# Patient Record
Sex: Female | Born: 1950 | Race: White | Hispanic: No | Marital: Single | Smoking: Never smoker
Health system: Southern US, Community
[De-identification: ages and names within clinical notes are randomized; demographics above are authoritative.]

## PROBLEM LIST (undated history)

## (undated) DIAGNOSIS — E669 Obesity, unspecified: Secondary | ICD-10-CM

## (undated) DIAGNOSIS — M5430 Sciatica, unspecified side: Secondary | ICD-10-CM

---

## 2021-03-01 ENCOUNTER — Emergency Department (HOSPITAL_COMMUNITY): Payer: Medicare HMO

## 2021-03-01 ENCOUNTER — Encounter (HOSPITAL_COMMUNITY): Payer: Self-pay | Admitting: Emergency Medicine

## 2021-03-01 ENCOUNTER — Other Ambulatory Visit: Payer: Self-pay

## 2021-03-01 ENCOUNTER — Inpatient Hospital Stay (HOSPITAL_COMMUNITY)
Admission: EM | Admit: 2021-03-01 | Discharge: 2021-03-06 | DRG: 177 | Disposition: A | Discharge status: Home / Self Care | Payer: Medicare HMO | Attending: Student | Admitting: Student

## 2021-03-01 DIAGNOSIS — R531 Weakness: Secondary | ICD-10-CM | POA: Diagnosis not present

## 2021-03-01 DIAGNOSIS — R739 Hyperglycemia, unspecified: Secondary | ICD-10-CM | POA: Diagnosis not present

## 2021-03-01 DIAGNOSIS — U071 COVID-19: Principal | ICD-10-CM

## 2021-03-01 DIAGNOSIS — D6959 Other secondary thrombocytopenia: Secondary | ICD-10-CM | POA: Diagnosis present

## 2021-03-01 DIAGNOSIS — J9601 Acute respiratory failure with hypoxia: Secondary | ICD-10-CM | POA: Diagnosis present

## 2021-03-01 DIAGNOSIS — R030 Elevated blood-pressure reading, without diagnosis of hypertension: Secondary | ICD-10-CM | POA: Diagnosis present

## 2021-03-01 DIAGNOSIS — Z6841 Body Mass Index (BMI) 40.0 and over, adult: Secondary | ICD-10-CM

## 2021-03-01 DIAGNOSIS — N179 Acute kidney failure, unspecified: Secondary | ICD-10-CM

## 2021-03-01 DIAGNOSIS — Z8249 Family history of ischemic heart disease and other diseases of the circulatory system: Secondary | ICD-10-CM

## 2021-03-01 DIAGNOSIS — T380X5A Adverse effect of glucocorticoids and synthetic analogues, initial encounter: Secondary | ICD-10-CM | POA: Diagnosis not present

## 2021-03-01 DIAGNOSIS — E876 Hypokalemia: Secondary | ICD-10-CM

## 2021-03-01 DIAGNOSIS — Z28310 Unvaccinated for covid-19: Secondary | ICD-10-CM

## 2021-03-01 DIAGNOSIS — J1282 Pneumonia due to coronavirus disease 2019: Secondary | ICD-10-CM | POA: Diagnosis present

## 2021-03-01 DIAGNOSIS — D72819 Decreased white blood cell count, unspecified: Secondary | ICD-10-CM

## 2021-03-01 DIAGNOSIS — M5430 Sciatica, unspecified side: Secondary | ICD-10-CM | POA: Diagnosis present

## 2021-03-01 HISTORY — DX: Sciatica, unspecified side: M54.30

## 2021-03-01 HISTORY — DX: Obesity, unspecified: E66.9

## 2021-03-01 NOTE — ED Provider Notes (Signed)
Emergency Medicine Provider Triage Evaluation Note  Deshante Cassell , a 70 y.o. female  was evaluated in triage.  Pt complains of cough and decreased appetite.  Patient states she tested positive for COVID-19 5 days ago.  She notes symptoms started on 8/1.  She is unvaccinated against COVID-19.  Patient endorses shortness of breath only while coughing.  Denies chest pain.  Denies nausea, vomiting, diarrhea, abdominal pain.  Review of Systems  Positive: Cough, SOB Negative: CP  Physical Exam  SpO2 95%  Gen:   Awake, no distress   Resp:  Normal effort  MSK:   Moves extremities without difficulty  Other:    Medical Decision Making  Medically screening exam initiated at 11:31 PM.  Appropriate orders placed.  Lola Czerwonka was informed that the remainder of the evaluation will be completed by another provider, this initial triage assessment does not replace that evaluation, and the importance of remaining in the ED until their evaluation is complete.  Routine labs ordered CXR EKG   Jesusita Oka 03/01/21 2332    Zadie Rhine, MD 03/03/21 [phone]

## 2021-03-01 NOTE — ED Triage Notes (Signed)
Pt arrived via EMS from home. Pt is covid positive and feels weak and lethargic. Pt has not been eating well and feels ill. Per EMS vitals stable.

## 2021-03-02 ENCOUNTER — Encounter (HOSPITAL_COMMUNITY): Payer: Self-pay | Admitting: Internal Medicine

## 2021-03-02 DIAGNOSIS — J1282 Pneumonia due to coronavirus disease 2019: Secondary | ICD-10-CM | POA: Diagnosis present

## 2021-03-02 DIAGNOSIS — N179 Acute kidney failure, unspecified: Secondary | ICD-10-CM

## 2021-03-02 DIAGNOSIS — R531 Weakness: Secondary | ICD-10-CM | POA: Diagnosis present

## 2021-03-02 DIAGNOSIS — U071 COVID-19: Secondary | ICD-10-CM | POA: Diagnosis present

## 2021-03-02 DIAGNOSIS — D72819 Decreased white blood cell count, unspecified: Secondary | ICD-10-CM | POA: Diagnosis present

## 2021-03-02 DIAGNOSIS — Z8249 Family history of ischemic heart disease and other diseases of the circulatory system: Secondary | ICD-10-CM | POA: Diagnosis not present

## 2021-03-02 DIAGNOSIS — E876 Hypokalemia: Secondary | ICD-10-CM | POA: Diagnosis present

## 2021-03-02 DIAGNOSIS — J9601 Acute respiratory failure with hypoxia: Secondary | ICD-10-CM

## 2021-03-02 DIAGNOSIS — D6959 Other secondary thrombocytopenia: Secondary | ICD-10-CM | POA: Diagnosis present

## 2021-03-02 DIAGNOSIS — Z28310 Unvaccinated for covid-19: Secondary | ICD-10-CM | POA: Diagnosis not present

## 2021-03-02 DIAGNOSIS — R739 Hyperglycemia, unspecified: Secondary | ICD-10-CM | POA: Diagnosis not present

## 2021-03-02 DIAGNOSIS — R748 Abnormal levels of other serum enzymes: Secondary | ICD-10-CM | POA: Diagnosis not present

## 2021-03-02 DIAGNOSIS — M5430 Sciatica, unspecified side: Secondary | ICD-10-CM | POA: Diagnosis present

## 2021-03-02 DIAGNOSIS — R7989 Other specified abnormal findings of blood chemistry: Secondary | ICD-10-CM | POA: Diagnosis not present

## 2021-03-02 DIAGNOSIS — Z6841 Body Mass Index (BMI) 40.0 and over, adult: Secondary | ICD-10-CM | POA: Diagnosis not present

## 2021-03-02 DIAGNOSIS — T380X5A Adverse effect of glucocorticoids and synthetic analogues, initial encounter: Secondary | ICD-10-CM | POA: Diagnosis not present

## 2021-03-02 DIAGNOSIS — R030 Elevated blood-pressure reading, without diagnosis of hypertension: Secondary | ICD-10-CM | POA: Diagnosis present

## 2021-03-02 LAB — CBC WITH DIFFERENTIAL/PLATELET
Abs Immature Granulocytes: 0.01 10*3/uL (ref 0.00–0.07)
Basophils Absolute: 0 10*3/uL (ref 0.0–0.1)
Basophils Relative: 0 %
Eosinophils Absolute: 0 10*3/uL (ref 0.0–0.5)
Eosinophils Relative: 0 %
HCT: 47.1 % — ABNORMAL HIGH (ref 36.0–46.0)
Hemoglobin: 14.9 g/dL (ref 12.0–15.0)
Immature Granulocytes: 0 %
Lymphocytes Relative: 25 %
Lymphs Abs: 0.8 10*3/uL (ref 0.7–4.0)
MCH: 28.2 pg (ref 26.0–34.0)
MCHC: 31.6 g/dL (ref 30.0–36.0)
MCV: 89 fL (ref 80.0–100.0)
Monocytes Absolute: 0.4 10*3/uL (ref 0.1–1.0)
Monocytes Relative: 11 %
Neutro Abs: 2.1 10*3/uL (ref 1.7–7.7)
Neutrophils Relative %: 64 %
Platelets: 164 10*3/uL (ref 150–400)
RBC: 5.29 MIL/uL — ABNORMAL HIGH (ref 3.87–5.11)
RDW: 12.9 % (ref 11.5–15.5)
WBC: 3.3 10*3/uL — ABNORMAL LOW (ref 4.0–10.5)
nRBC: 0 % (ref 0.0–0.2)

## 2021-03-02 LAB — TROPONIN I (HIGH SENSITIVITY)
Troponin I (High Sensitivity): 12 ng/L (ref <18)
Troponin I (High Sensitivity): 12 ng/L (ref <18)

## 2021-03-02 LAB — LACTATE DEHYDROGENASE: LDH: 199 U/L — ABNORMAL HIGH (ref 98–192)

## 2021-03-02 LAB — C-REACTIVE PROTEIN: CRP: 6.3 mg/dL — ABNORMAL HIGH (ref <1.0)

## 2021-03-02 LAB — D-DIMER, QUANTITATIVE: D-Dimer, Quant: 1.09 ug/mL-FEU — ABNORMAL HIGH (ref 0.00–0.50)

## 2021-03-02 LAB — CBC
HCT: 43.9 % (ref 36.0–46.0)
Hemoglobin: 14.1 g/dL (ref 12.0–15.0)
MCH: 28.8 pg (ref 26.0–34.0)
MCHC: 32.1 g/dL (ref 30.0–36.0)
MCV: 89.6 fL (ref 80.0–100.0)
Platelets: 139 10*3/uL — ABNORMAL LOW (ref 150–400)
RBC: 4.9 MIL/uL (ref 3.87–5.11)
RDW: 12.9 % (ref 11.5–15.5)
WBC: 3.2 10*3/uL — ABNORMAL LOW (ref 4.0–10.5)
nRBC: 0 % (ref 0.0–0.2)

## 2021-03-02 LAB — CREATININE, SERUM
Creatinine, Ser: 0.93 mg/dL (ref 0.44–1.00)
GFR, Estimated: >60 mL/min (ref >60)

## 2021-03-02 LAB — GLUCOSE, CAPILLARY: Glucose-Capillary: 145 mg/dL — ABNORMAL HIGH (ref 70–99)

## 2021-03-02 LAB — MAGNESIUM: Magnesium: 1.5 mg/dL — ABNORMAL LOW (ref 1.7–2.4)

## 2021-03-02 LAB — BASIC METABOLIC PANEL
Anion gap: 12 (ref 5–15)
BUN: 16 mg/dL (ref 8–23)
CO2: 28 mmol/L (ref 22–32)
Calcium: 8.8 mg/dL — ABNORMAL LOW (ref 8.9–10.3)
Chloride: 97 mmol/L — ABNORMAL LOW (ref 98–111)
Creatinine, Ser: 1.02 mg/dL — ABNORMAL HIGH (ref 0.44–1.00)
GFR, Estimated: 60 mL/min — ABNORMAL LOW (ref >60)
Glucose, Bld: 111 mg/dL — ABNORMAL HIGH (ref 70–99)
Potassium: 3.3 mmol/L — ABNORMAL LOW (ref 3.5–5.1)
Sodium: 137 mmol/L (ref 135–145)

## 2021-03-02 LAB — BRAIN NATRIURETIC PEPTIDE: B Natriuretic Peptide: 20.2 pg/mL (ref 0.0–100.0)

## 2021-03-02 LAB — FERRITIN: Ferritin: 634 ng/mL — ABNORMAL HIGH (ref 11–307)

## 2021-03-02 LAB — RESP PANEL BY RT-PCR (FLU A&B, COVID) ARPGX2
Influenza A by PCR: NEGATIVE
Influenza B by PCR: NEGATIVE
SARS Coronavirus 2 by RT PCR: POSITIVE — AB

## 2021-03-02 LAB — HEPATITIS B SURFACE ANTIGEN: Hepatitis B Surface Ag: NONREACTIVE

## 2021-03-02 LAB — FIBRINOGEN: Fibrinogen: 532 mg/dL — ABNORMAL HIGH (ref 210–475)

## 2021-03-02 LAB — PROCALCITONIN: Procalcitonin: <0.1 ng/mL

## 2021-03-02 LAB — HIV ANTIBODY (ROUTINE TESTING W REFLEX): HIV Screen 4th Generation wRfx: NONREACTIVE

## 2021-03-02 MED ORDER — ASCORBIC ACID 500 MG PO TABS
500.0000 mg | ORAL_TABLET | Freq: Every day | ORAL | Status: DC
  Administered 2021-03-02 – 2021-03-06 (×5): 500 mg via ORAL
  Filled 2021-03-02 (×5): qty 1

## 2021-03-02 MED ORDER — ONDANSETRON 4 MG PO TBDP
4.0000 mg | ORAL_TABLET | Freq: Once | ORAL | Status: AC
  Administered 2021-03-02: 4 mg via ORAL
  Filled 2021-03-02: qty 1

## 2021-03-02 MED ORDER — POTASSIUM CHLORIDE CRYS ER 20 MEQ PO TBCR
40.0000 meq | EXTENDED_RELEASE_TABLET | Freq: Two times a day (BID) | ORAL | Status: AC
  Administered 2021-03-02 (×2): 40 meq via ORAL
  Filled 2021-03-02 (×2): qty 2

## 2021-03-02 MED ORDER — PREDNISONE 50 MG PO TABS
50.0000 mg | ORAL_TABLET | Freq: Every day | ORAL | Status: DC
  Administered 2021-03-06: 50 mg via ORAL
  Filled 2021-03-02: qty 1

## 2021-03-02 MED ORDER — ONDANSETRON HCL 4 MG PO TABS: 4.0000 mg | ORAL_TABLET | Freq: Four times a day (QID) | ORAL | Status: DC | PRN

## 2021-03-02 MED ORDER — SODIUM CHLORIDE 0.9 % IV BOLUS
1000.0000 mL | Freq: Once | INTRAVENOUS | Status: AC
Start: 2021-03-02 — End: 2021-03-02
  Administered 2021-03-02: 1000 mL via INTRAVENOUS

## 2021-03-02 MED ORDER — ACETAMINOPHEN 325 MG PO TABS
650.0000 mg | ORAL_TABLET | Freq: Four times a day (QID) | ORAL | Status: DC | PRN
  Filled 2021-03-02: qty 2

## 2021-03-02 MED ORDER — SODIUM CHLORIDE 0.9 % IV SOLN
200.0000 mg | Freq: Once | INTRAVENOUS | Status: AC
  Administered 2021-03-02: 200 mg via INTRAVENOUS
  Filled 2021-03-02: qty 40

## 2021-03-02 MED ORDER — IPRATROPIUM-ALBUTEROL 0.5-2.5 (3) MG/3ML IN SOLN
3.0000 mL | Freq: Once | RESPIRATORY_TRACT | Status: AC
  Administered 2021-03-02: 3 mL via RESPIRATORY_TRACT
  Filled 2021-03-02: qty 3

## 2021-03-02 MED ORDER — GUAIFENESIN ER 600 MG PO TB12
600.0000 mg | ORAL_TABLET | Freq: Two times a day (BID) | ORAL | Status: DC | PRN
  Administered 2021-03-02 – 2021-03-05 (×5): 600 mg via ORAL
  Filled 2021-03-02 (×5): qty 1

## 2021-03-02 MED ORDER — SODIUM CHLORIDE 0.9 % IV SOLN
100.0000 mg | Freq: Every day | INTRAVENOUS | Status: AC
  Administered 2021-03-03 – 2021-03-06 (×4): 100 mg via INTRAVENOUS
  Filled 2021-03-02 (×4): qty 20

## 2021-03-02 MED ORDER — ZINC SULFATE 220 (50 ZN) MG PO CAPS
220.0000 mg | ORAL_CAPSULE | Freq: Every day | ORAL | Status: DC
  Administered 2021-03-02 – 2021-03-06 (×5): 220 mg via ORAL
  Filled 2021-03-02 (×5): qty 1

## 2021-03-02 MED ORDER — MAGNESIUM SULFATE 2 GM/50ML IV SOLN
2.0000 g | Freq: Once | INTRAVENOUS | Status: AC
  Administered 2021-03-02: 2 g via INTRAVENOUS
  Filled 2021-03-02: qty 50

## 2021-03-02 MED ORDER — DEXAMETHASONE SODIUM PHOSPHATE 10 MG/ML IJ SOLN
6.0000 mg | Freq: Once | INTRAMUSCULAR | Status: AC
  Administered 2021-03-02: 6 mg via INTRAVENOUS
  Filled 2021-03-02: qty 1

## 2021-03-02 MED ORDER — ONDANSETRON HCL 4 MG/2ML IJ SOLN: 4.0000 mg | Freq: Four times a day (QID) | INTRAMUSCULAR | Status: DC | PRN

## 2021-03-02 MED ORDER — METHYLPREDNISOLONE SODIUM SUCC 125 MG IJ SOLR
1.0000 mg/kg | Freq: Two times a day (BID) | INTRAMUSCULAR | Status: AC
  Administered 2021-03-02 – 2021-03-05 (×6): 113.125 mg via INTRAVENOUS
  Filled 2021-03-02 (×8): qty 2

## 2021-03-02 MED ORDER — ENOXAPARIN SODIUM 40 MG/0.4ML IJ SOSY
40.0000 mg | PREFILLED_SYRINGE | INTRAMUSCULAR | Status: DC
  Administered 2021-03-02 – 2021-03-04 (×3): 40 mg via SUBCUTANEOUS
  Filled 2021-03-02 (×3): qty 0.4

## 2021-03-02 NOTE — ED Notes (Signed)
Per prior shift, the patient o2 sat dropped to 85% when ambulating.

## 2021-03-02 NOTE — ED Provider Notes (Signed)
Received signout at the beginning of shift, please see previous providers note for complete H&P.  This is a overall generally healthy 70 year old female who developed COVID symptoms 11 days ago, had a positive at home COVID test 10 days ago.  She is here due to progressive weakness as well as having shortness of breath.  She has an O2 sats of 85% while on ambulation, improved with 2 L of supplemental oxygen.  Chest x-ray shows streaky left greater than right perihilar opacity suspicious for pneumonia.  Labs overall reassuring.  Appreciate consultation from [facility] hospitalist, Dr. Tempie Donning who request for remdesivir and steroids to be started, to also obtain another COVID test.  She will admit patient for further care.  BP 113/71 (BP Location: Left Arm)   Pulse (!) 102   Temp 99.4 F (37.4 C) (Oral)   Resp 20   Ht 5\' 5"  (1.651 m)   Wt 113.4 kg   SpO2 95%   BMI 41.60 kg/m   Results for orders placed or performed during the hospital encounter of 03/01/21  CBC with Differential  Result Value Ref Range   WBC 3.3 (L) 4.0 - 10.5 K/uL   RBC 5.29 (H) 3.87 - 5.11 MIL/uL   Hemoglobin 14.9 12.0 - 15.0 g/dL   HCT 05/01/21 (H) 71.2 - 45.8 %   MCV 89.0 80.0 - 100.0 fL   MCH 28.2 26.0 - 34.0 pg   MCHC 31.6 30.0 - 36.0 g/dL   RDW 09.9 83.3 - 82.5 %   Platelets 164 150 - 400 K/uL   nRBC 0.0 0.0 - 0.2 %   Neutrophils Relative % 64 %   Neutro Abs 2.1 1.7 - 7.7 K/uL   Lymphocytes Relative 25 %   Lymphs Abs 0.8 0.7 - 4.0 K/uL   Monocytes Relative 11 %   Monocytes Absolute 0.4 0.1 - 1.0 K/uL   Eosinophils Relative 0 %   Eosinophils Absolute 0.0 0.0 - 0.5 K/uL   Basophils Relative 0 %   Basophils Absolute 0.0 0.0 - 0.1 K/uL   Immature Granulocytes 0 %   Abs Immature Granulocytes 0.01 0.00 - 0.07 K/uL  Basic metabolic panel  Result Value Ref Range   Sodium 137 135 - 145 mmol/L   Potassium 3.3 (L) 3.5 - 5.1 mmol/L   Chloride 97 (L) 98 - 111 mmol/L   CO2 28 22 - 32 mmol/L   Glucose, Bld 111 (H) 70 - 99  mg/dL   BUN 16 8 - 23 mg/dL   Creatinine, Ser 05.3 (H) 0.44 - 1.00 mg/dL   Calcium 8.8 (L) 8.9 - 10.3 mg/dL   GFR, Estimated 60 (L) >60 mL/min   Anion gap 12 5 - 15   DG Chest Portable 1 View  Result Date: 03/01/2021 CLINICAL DATA:  Cough and body ache COVID EXAM: PORTABLE CHEST 1 VIEW COMPARISON:  None. FINDINGS: Mild cardiomegaly. Streaky left greater than right perihilar opacity. No pleural effusion or pneumothorax IMPRESSION: 1. Streaky left greater than right perihilar opacity suspicious for pneumonia 2. Mild cardiomegaly Electronically Signed   By: 05/01/2021 M.D.   On: 03/01/2021 23:51      05/01/2021, PA-C 03/02/21 05/02/21    [id number], MD 03/02/21 [phone]

## 2021-03-02 NOTE — ED Notes (Signed)
Patient's oxygen saturations dropped to 70% on room air when getting up to the [facility].  Placed patient on oxygen at 2L oxygen with improvement in saturations to 96%.

## 2021-03-02 NOTE — Progress Notes (Signed)
Patient arrived to 1442 in NAD, VS stable and patient free from pain. Patient oriented to room and call bell in reach.  

## 2021-03-02 NOTE — ED Provider Notes (Signed)
[facility]-EMERGENCY DEPT Provider Note   CSN: [id number] Arrival date & time: 03/01/21  2319     History Chief Complaint  Patient presents with   Covid Positive    Stacie Logan is a 70 y.o. female with a history of sciatica who presents the emergency department with a chief complaint of weakness.  The patient reports that he developed a headache and fever on August 1.  She completed COVID test at home the following day and it was positive.  Since onset of her symptoms, she has developed worsening shortness of breath, cough, and worsening generalized weakness.  She suspects that this may be due to anorexia as she has had no intact appetite and has had to force herself to eat.  She has had loss of sense of taste and smell.  Reports that she has only voided twice today.  She is feeling more lightheaded with ambulation, but denies falls.  She states that she lives alone and she is concerned that due to the amount of weakness and fatigue that she is unsafe at home.   No visual changes, numbness, weakness, chest pain, vomiting, diarrhea, abdominal pain, back pain, dysuria, hematuria.  She has no history of underlying pulmonary disease.  She is unvaccinated against COVID.  The history is provided by the patient and medical records. No language interpreter was used.      History reviewed. No pertinent past medical history.  There are no problems to display for this patient.   History reviewed. No pertinent surgical history.   OB History   No obstetric history on file.     History reviewed. No pertinent family history.  Social History   Tobacco Use   Smoking status: Never   Smokeless tobacco: Never  Substance Use Topics   Alcohol use: Yes    Comment: socially   Drug use: Never    Home Medications Prior to Admission medications   Not on File    Allergies    Patient has no allergy information on record.  Review of Systems   Review of Systems   Constitutional:  Positive for activity change, appetite change, fatigue and fever. Negative for chills.  HENT:  Negative for congestion and sore throat.   Eyes:  Negative for visual disturbance.  Respiratory:  Positive for cough and shortness of breath.   Cardiovascular:  Negative for chest pain.  Gastrointestinal:  Negative for abdominal pain, blood in stool, constipation, diarrhea, nausea and vomiting.  Genitourinary:  Negative for dysuria.  Musculoskeletal:  Negative for back pain, myalgias, neck pain and neck stiffness.  Skin:  Negative for rash and wound.  Allergic/Immunologic: Negative for immunocompromised state.  Neurological:  Positive for headaches. Negative for seizures, syncope, weakness and numbness.  Psychiatric/Behavioral:  Negative for confusion.    Physical Exam Updated Vital Signs BP 113/71 (BP Location: Left Arm)   Pulse (!) 102   Temp 99.4 F (37.4 C) (Oral)   Resp 20   Ht 5\' 5"  (1.651 m)   Wt 113.4 kg   SpO2 95%   BMI 41.60 kg/m   Physical Exam Vitals and nursing note reviewed.  Constitutional:      General: She is not in acute distress.    Appearance: She is obese. She is not ill-appearing, toxic-appearing or diaphoretic.  HENT:     Head: Normocephalic.  Eyes:     Conjunctiva/sclera: Conjunctivae normal.  Cardiovascular:     Rate and Rhythm: Normal rate and regular rhythm.  Heart sounds: No murmur heard.   No friction rub. No gallop.  Pulmonary:     Effort: Pulmonary effort is normal. No respiratory distress.     Breath sounds: No stridor. No wheezing, rhonchi or rales.     Comments: Coughs throughout exam Chest:     Chest wall: No tenderness.  Abdominal:     General: There is no distension.     Palpations: Abdomen is soft. There is no mass.     Tenderness: There is no abdominal tenderness. There is no right CVA tenderness, left CVA tenderness, guarding or rebound.     Hernia: No hernia is present.  Musculoskeletal:        General: No  tenderness.     Cervical back: Neck supple.     Right lower leg: No edema.     Left lower leg: No edema.  Skin:    General: Skin is warm.     Coloration: Skin is not jaundiced.     Findings: No rash.  Neurological:     Mental Status: She is alert.  Psychiatric:        Behavior: Behavior normal.    ED Results / Procedures / Treatments   Labs (all labs ordered are listed, but only abnormal results are displayed) Labs Reviewed  CBC WITH DIFFERENTIAL/PLATELET - Abnormal; Notable for the following components:      Result Value   WBC 3.3 (*)    RBC 5.29 (*)    HCT 47.1 (*)    All other components within normal limits  BASIC METABOLIC PANEL - Abnormal; Notable for the following components:   Potassium 3.3 (*)    Chloride 97 (*)    Glucose, Bld 111 (*)    Creatinine, Ser 1.02 (*)    Calcium 8.8 (*)    GFR, Estimated 60 (*)    All other components within normal limits    EKG EKG Interpretation  Date/Time:  Thursday March 02 2021 00:18:49 EDT Ventricular Rate:  94 PR Interval:  158 QRS Duration: 87 QT Interval:  347 QTC Calculation: 434 R Axis:   69 Text Interpretation: Sinus rhythm Low voltage, precordial leads 12 Lead; Mason-Likar Confirmed by Zadie Rhine ([id number]) on 03/02/2021 12:23:31 AM  Radiology DG Chest Portable 1 View  Result Date: 03/01/2021 CLINICAL DATA:  Cough and body ache COVID EXAM: PORTABLE CHEST 1 VIEW COMPARISON:  None. FINDINGS: Mild cardiomegaly. Streaky left greater than right perihilar opacity. No pleural effusion or pneumothorax IMPRESSION: 1. Streaky left greater than right perihilar opacity suspicious for pneumonia 2. Mild cardiomegaly Electronically Signed   By: Jasmine Pang M.D.   On: 03/01/2021 23:51    Procedures Procedures   Medications Ordered in ED Medications  sodium chloride 0.9 % bolus 1,000 mL (0 mLs Intravenous Stopped 03/02/21 0653)  ipratropium-albuterol (DUONEB) 0.5-2.5 (3) MG/3ML nebulizer solution 3 mL (3 mLs Nebulization  Given 03/02/21 0422)  ondansetron (ZOFRAN-ODT) disintegrating tablet 4 mg (4 mg Oral Given 03/02/21 4818)    ED Course  I have reviewed the triage vital signs and the nursing notes.  Pertinent labs & imaging results that were available during my care of the patient were reviewed by me and considered in my medical decision making (see chart for details).  Clinical Course as of 03/02/21 0733  Thu Mar 02, 2021  0630 Patient rechecked.  Noted to have oxygen saturation of 88 to 90% on room air on reevaluation.  Placed on 2 L nasal cannula.  Spoke with tech.  Patient  was ambulated and had to episodes of hypoxia to 85%.  While ambulating, the patient had to stop twice and catch her breath.  She had mild associated tachycardia while ambulating.  RN confirmed earlier that when patient was moved from room to to room 20 that after walking approximately 10 feet that the patient became very dyspneic and had to be transported using a wheelchair.  [MM]    Clinical Course User Index [MM] Aarav Burgett, Coral Else, PA-C   MDM Rules/Calculators/A&P                           70 year old female with history of sciatica who is unvaccinated against COVID-19 who presents the emergency department with worsening weakness and fatigue.  She has been symptomatic against COVID-19 since August 1 and had a positive at home COVID test on August 2.  Vital signs are stable.  Afebrile.  Normotensive.  Oxygen saturation was initially 95 to 97%.  However, patient became very dyspneic with ambulation when she was changing rooms.  She was ambulated by staff and was hypoxic to 85%.  She was also noted to be satting at 88% with good waveform on the monitor on reevaluation and was started on 2 L nasal cannula.  No metabolic derangements.  CBC does appear mildly hemoconcentrated, but no previous available for comparison.  IV fluids given.  She was given Zofran for nausea.  DuoNeb given with some improvement in cough.  Lungs remain clear to  auscultation bilaterally.  However, given the patient's acute respiratory failure, she will require admission for further work-up and evaluation.   Hospitalist team will admit for further work-up and evaluation. The patient appears reasonably stabilized for admission considering the current resources, flow, and capabilities available in the ED at this time, and I doubt any other [facility] requiring further screening and/or treatment in the ED prior to admission.   Final Clinical Impression(s) / ED Diagnoses Final diagnoses:  COVID-19  Acute respiratory failure with hypoxia [facility])    Rx / DC Orders ED Discharge Orders     None        Barkley Boards, PA-C 03/02/21 0733    Zadie Rhine, MD 03/03/21 [phone]

## 2021-03-02 NOTE — H&P (Signed)
History and Physical    Stacie Logan TMH:[id number] DOB: 05-31-1951 DOA: 03/01/2021  PCP: Pcp, No  Patient coming from: Home  I have personally briefly reviewed patient's old medical records in [facility]  Chief Complaint: Generalized weakness and cough  HPI: Stacie Logan is a 70 y.o. female with medical history significant of obesity, sciatica presented with complaint of generalized weakness and cough,  Patient reports that she had headache and fever and tested positive for COVID-19 on August 2.  Since then she has productive cough with yellow sputum, shortness of breath, generalized weakness, decreased appetite, loss of sense of taste and smell, lightheadedness and fatigue.  She checked home COVID test again couple of days ago which was positive.  She came to ER due to worsening of her weakness and cough with shortness of breath.  No fever, chills, headache, blurry vision, chest pain, wheezing, syncope, fall, palpitation, leg swelling, orthopnea, PND, nausea, vomiting, diarrhea, abdominal pain.  No history of smoking, alcohol, licit drug use.  She is not vaccinated against COVID-19.  ED Course: Upon arrival to ED: Patient's vital signs stable.  Her oxygen saturation dropped in 80s while ambulation and placed on 2 L of oxygen via nasal cannula.  She is afebrile with WBC of 3.3.  CMP shows potassium of 3.3 and mild AKI.  Chest x-ray shows COVID-pneumonia patient received IV fluids, duo nebs and Zofran in ED.  [facility] hospitalist consulted for admission for acute hypoxemic respiratory failure due to COVID-pneumonia.  Review of Systems: As per HPI otherwise negative.    Past Medical History:  Diagnosis Date   Obesity    Sciatica     History reviewed. No pertinent surgical history.   reports that she has never smoked. She has never used smokeless tobacco. She reports current alcohol use of about 1.0 standard drink per week. She reports that she does not use drugs.  No Known  Allergies  Family History  Problem Relation Age of Onset   Hypertension Father     Prior to Admission medications   Not on File    Physical Exam: Vitals:   03/02/21 0900 03/02/21 0910 03/02/21 0912 03/02/21 0930  BP: (!) 129/96   131/71  Pulse: 69   81  Resp:    11  Temp:      TempSrc:      SpO2: 91% (!) 70% 96% 95%  Weight:      Height:        Constitutional: NAD, calm, comfortable, communicating well Eyes: PERRL, lids and conjunctivae normal ENMT: Mucous membranes are moist. Posterior pharynx clear of any exudate or lesions.Normal dentition.  Neck: normal, supple, no masses, no thyromegaly Respiratory: clear to auscultation bilaterally, no wheezing, no crackles. Normal respiratory effort. No accessory muscle use.  Cardiovascular: Regular rate and rhythm, no murmurs / rubs / gallops. No extremity edema. 2+ pedal pulses. No carotid bruits.  Abdomen: no tenderness, no masses palpated. No hepatosplenomegaly. Bowel sounds positive.  Musculoskeletal: no clubbing / cyanosis. No joint deformity upper and lower extremities. Good ROM, no contractures. Normal muscle tone.  Skin: no rashes, lesions, ulcers. No induration Neurologic: CN 2-12 grossly intact. Sensation intact, DTR normal. Strength 5/5 in all 4.  Psychiatric: Normal judgment and insight. Alert and oriented x 3. Normal mood.    Labs on Admission: I have personally reviewed following labs and imaging studies  CBC: Recent Labs  Lab 03/01/21 2347 03/02/21 0741  WBC 3.3* 3.2*  NEUTROABS 2.1  --   HGB  14.9 14.1  HCT 47.1* 43.9  MCV 89.0 89.6  PLT 164 139*   Basic Metabolic Panel: Recent Labs  Lab 03/01/21 2347 03/02/21 0741  NA 137  --   K 3.3*  --   CL 97*  --   CO2 28  --   GLUCOSE 111*  --   BUN 16  --   CREATININE 1.02* 0.93  CALCIUM 8.8*  --   MG  --  1.5*   GFR: Estimated Creatinine Clearance: 71.7 mL/min (by C-G formula based on SCr of 0.93 mg/dL). Liver Function Tests: No results for input(s):  AST, ALT, ALKPHOS, BILITOT, PROT, ALBUMIN in the last 168 hours. No results for input(s): LIPASE, AMYLASE in the last 168 hours. No results for input(s): AMMONIA in the last 168 hours. Coagulation Profile: No results for input(s): INR, PROTIME in the last 168 hours. Cardiac Enzymes: No results for input(s): CKTOTAL, CKMB, CKMBINDEX, TROPONINI in the last 168 hours. BNP (last 3 results) No results for input(s): PROBNP in the last 8760 hours. HbA1C: No results for input(s): HGBA1C in the last 72 hours. CBG: No results for input(s): GLUCAP in the last 168 hours. Lipid Profile: No results for input(s): CHOL, HDL, LDLCALC, TRIG, CHOLHDL, LDLDIRECT in the last 72 hours. Thyroid Function Tests: No results for input(s): TSH, T4TOTAL, FREET4, T3FREE, THYROIDAB in the last 72 hours. Anemia Panel: Recent Labs    03/02/21 0743  FERRITIN 634*   Urine analysis: No results found for: COLORURINE, APPEARANCEUR, LABSPEC, PHURINE, GLUCOSEU, HGBUR, BILIRUBINUR, KETONESUR, PROTEINUR, UROBILINOGEN, NITRITE, LEUKOCYTESUR  Radiological Exams on Admission: DG Chest Portable 1 View  Result Date: 03/01/2021 CLINICAL DATA:  Cough and body ache COVID EXAM: PORTABLE CHEST 1 VIEW COMPARISON:  None. FINDINGS: Mild cardiomegaly. Streaky left greater than right perihilar opacity. No pleural effusion or pneumothorax IMPRESSION: 1. Streaky left greater than right perihilar opacity suspicious for pneumonia 2. Mild cardiomegaly Electronically Signed   By: Jasmine Pang M.D.   On: 03/01/2021 23:51    EKG: Independently reviewed.  Normal sinus rhythm.  No acute ST-T wave changes noted.  Assessment/Plan Principal Problem:   Acute hypoxemic respiratory failure due to COVID-19 [facility]) Active Problems:   Pneumonia due to COVID-19 virus   Hypokalemia   AKI (acute kidney injury) (HCC)   Leukopenia     Acute hypoxemic respiratory failure due to COVID-pneumonia: -Patient tested positive for COVID at home recently.  She  is unvaccinated.  She is requiring 2 L of oxygen via nasal cannula.  Chest x-ray shows streaky left greater than right perihilar opacity suspicious for pneumonia. -She is afebrile. -Start remdesivir and steroids -Check inflammatory markers.  Repeat inflammatory markers tomorrow. -On continuous pulse ox.  We will try to wean off of oxygen as tolerated -As needed antitussive.  Start on multivitamin  Hypokalemia: Replenished -Repeat BMP tomorrow a.m.  Hypomagnesemia: Replenished -Repeat magnesium level tomorrow a.m.  Leukopenia: -No baseline labs available to compare -Could be due to viral infection -Monitor  Thrombocytopenia: Platelet 139 -Continue to monitor.  Mild AKI: -Creatinine 1.02, GFR 60.  No baseline labs available to to compare.   -Received IV fluids.  Repeat BMP tomorrow a.m.  Avoid nephrotoxic medications.  DVT prophylaxis: Lovenox/SCD Code Status: Full code Family Communication: None present at bedside.  Plan of care discussed with patient in length and she verbalized understanding and agreed with it. Disposition Plan: Likely home in 2 to 3 days Consults called: None Admission status: Inpatient   Ollen Bowl MD [facility]  If 7PM-7AM, please contact night-coverage [email or web address]  03/02/2021, 11:37 AM

## 2021-03-02 NOTE — Progress Notes (Signed)
Flutter valve given to pt. Pt knows and understands how to use. 

## 2021-03-03 ENCOUNTER — Inpatient Hospital Stay (HOSPITAL_COMMUNITY): Payer: Medicare HMO

## 2021-03-03 DIAGNOSIS — U071 COVID-19: Secondary | ICD-10-CM | POA: Diagnosis not present

## 2021-03-03 DIAGNOSIS — R7989 Other specified abnormal findings of blood chemistry: Secondary | ICD-10-CM

## 2021-03-03 LAB — CBC WITH DIFFERENTIAL/PLATELET
Abs Immature Granulocytes: 0.01 10*3/uL (ref 0.00–0.07)
Basophils Absolute: 0 10*3/uL (ref 0.0–0.1)
Basophils Relative: 0 %
Eosinophils Absolute: 0 10*3/uL (ref 0.0–0.5)
Eosinophils Relative: 0 %
HCT: 48.1 % — ABNORMAL HIGH (ref 36.0–46.0)
Hemoglobin: 14.8 g/dL (ref 12.0–15.0)
Immature Granulocytes: 0 %
Lymphocytes Relative: 46 %
Lymphs Abs: 1.4 10*3/uL (ref 0.7–4.0)
MCH: 28.2 pg (ref 26.0–34.0)
MCHC: 30.8 g/dL (ref 30.0–36.0)
MCV: 91.6 fL (ref 80.0–100.0)
Monocytes Absolute: 0.1 10*3/uL (ref 0.1–1.0)
Monocytes Relative: 5 %
Neutro Abs: 1.5 10*3/uL — ABNORMAL LOW (ref 1.7–7.7)
Neutrophils Relative %: 49 %
Platelets: 173 10*3/uL (ref 150–400)
RBC: 5.25 MIL/uL — ABNORMAL HIGH (ref 3.87–5.11)
RDW: 12.9 % (ref 11.5–15.5)
WBC: 3.1 10*3/uL — ABNORMAL LOW (ref 4.0–10.5)
nRBC: 0 % (ref 0.0–0.2)

## 2021-03-03 LAB — MAGNESIUM: Magnesium: 2.1 mg/dL (ref 1.7–2.4)

## 2021-03-03 LAB — COMPREHENSIVE METABOLIC PANEL
ALT: 53 U/L — ABNORMAL HIGH (ref 0–44)
AST: 55 U/L — ABNORMAL HIGH (ref 15–41)
Albumin: 3.5 g/dL (ref 3.5–5.0)
Alkaline Phosphatase: 60 U/L (ref 38–126)
Anion gap: 9 (ref 5–15)
BUN: 19 mg/dL (ref 8–23)
CO2: 27 mmol/L (ref 22–32)
Calcium: 9.2 mg/dL (ref 8.9–10.3)
Chloride: 106 mmol/L (ref 98–111)
Creatinine, Ser: 0.87 mg/dL (ref 0.44–1.00)
GFR, Estimated: >60 mL/min (ref >60)
Glucose, Bld: 156 mg/dL — ABNORMAL HIGH (ref 70–99)
Potassium: 4.7 mmol/L (ref 3.5–5.1)
Sodium: 142 mmol/L (ref 135–145)
Total Bilirubin: 0.7 mg/dL (ref 0.3–1.2)
Total Protein: 7.2 g/dL (ref 6.5–8.1)

## 2021-03-03 LAB — FERRITIN: Ferritin: 614 ng/mL — ABNORMAL HIGH (ref 11–307)

## 2021-03-03 LAB — PHOSPHORUS: Phosphorus: 3.3 mg/dL (ref 2.5–4.6)

## 2021-03-03 LAB — C-REACTIVE PROTEIN: CRP: 7.2 mg/dL — ABNORMAL HIGH (ref <1.0)

## 2021-03-03 LAB — D-DIMER, QUANTITATIVE: D-Dimer, Quant: 0.73 ug/mL-FEU — ABNORMAL HIGH (ref 0.00–0.50)

## 2021-03-03 NOTE — Progress Notes (Signed)
Mobility Specialist - Progress Note    03/03/21 1212  Oxygen Therapy  SpO2 90 %  O2 Device Nasal Cannula  O2 Flow Rate (L/min) 2 L/min  Mobility  Activity Ambulated in room  Level of Assistance Standby assist, set-up cues, supervision of patient - no hands on  Assistive Device [employer] Ambulated (ft) 20 ft  Mobility Ambulated with assistance in room  Mobility Response Tolerated well  Mobility performed by Mobility specialist  $Mobility charge 1 Mobility    Pre-mobility: 95%  SpO2 During mobility: 92%  SpO2 Post-mobility: 90-91% SPO2  Upon entry pt stated feeling more "winded and tired" compared to previous day. Pt was willing to ambulate, but felt worried about her O2. Pt stayed sitting EOB before ambulation, O2 levels were measured at 95% SPO2 on 2L. Pt stood EOB and O2 levels measured at 92% on 2L. Pt then ambulated in room ~20 ft and returned to EOB. O2 levels ranged between 90%-91% post ambulation. Pt was returned to EOB after session and was left with call bell at side. NT informed of session followed shortly after to check vitals.   Arliss Journey Mobility Specialist Acute Rehabilitation Services Phone: [phone] 03/03/21, 12:17 PM

## 2021-03-03 NOTE — Progress Notes (Addendum)
Stacie Logan  XIP:[id number] DOB: 07/13/1951 DOA: 03/01/2021 PCP: Pcp, No    Brief Narrative:  70yo with a history of obesity and chronic sciatica who presented to the ER with severe generalized weakness and persisting cough after having tested positive for COVID at home February 21, 2021.  She reported loss of sense of taste and smell, shortness of breath particularly with exertion, loss of appetite, lightheadedness, and fatigue.  In the ER the patient was found to have oxygen saturations into the 70s while ambulating.  CXR noted diffuse pulmonary infiltrates consistent with COVID-pneumonia.  Significant Events:  8/11 admit via ER  Date of Positive COVID Test:  03/02/21 (confirmed) 02/21/21 (reported per home testing)  COVID-19 specific Treatment: Solu-Medrol 8/11 > Remdesivir 8/11 >  Consultants:  None  Code Status: FULL CODE  Antimicrobials:  None   DVT prophylaxis: Lovenox  Subjective: The patient states she is feeling significantly better since her presentation.  She remains somewhat short of breath but is not in extremis.  She denies headache nausea or vomiting or chest pain.  Her appetite remains poor.  Assessment & Plan:  COVID-pneumonia with acute hypoxic respiratory failure Continue steroid and Remdesivir -monitor inflammatory markers -clinically much improved -is a bit unusual for her symptoms to persist/worsen so late in the illness  Recent Labs  Lab 03/02/21 0741 03/02/21 0743 03/03/21 0422  DDIMER 1.09*  --  0.73*  FERRITIN  --  634* 614*  CRP  --  6.3* 7.2*  ALT  --   --  53*  PROCALCITON <0.10  --   --     Elevated D-dimer Likely due to COVID -low suspicion for PE -trending downward -rule out venous thromboembolism -hold on CTa due to low suspicion in setting of dehydration/renal injury  Hypokalemia Corrected with supplementation  Hypomagnesemia Corrected with supplementation  Thrombocytopenia Due to acute COVID illness  Acute renal  injury Resolved with simple volume resuscitation  Severe Morbid obesity - Body mass index is 42.63 kg/m.   Family Communication:  Status is: Inpatient  Remains inpatient appropriate because:Inpatient level of care appropriate due to severity of illness  Dispo: The patient is from: Home              Anticipated d/c is to: Home              Patient currently is not medically stable to d/c.   Difficult to place patient No    Objective: Blood pressure (!) 193/99, pulse 62, temperature 97.7 F (36.5 C), temperature source Oral, resp. rate 20, height 5\' 5"  (1.651 m), weight 116.2 kg, SpO2 97 %.  Intake/Output Summary (Last 24 hours) at 03/03/2021 1037 Last data filed at 03/02/2021 2200 Gross per 24 hour  Intake 570.61 ml  Output --  Net 570.61 ml   Filed Weights   03/01/21 2333 03/02/21 1555  Weight: 113.4 kg 116.2 kg    Examination: General: No acute respiratory distress on conventional nasal cannula support Lungs: Fine crackles diffusely bilateral fields with no wheezing Cardiovascular: Regular rate and rhythm without murmur gallop or rub normal S1 and S2 Abdomen: Nontender, nondistended, soft, bowel sounds positive, no rebound, no ascites, no appreciable mass Extremities: No significant cyanosis, clubbing, or edema bilateral lower extremities  CBC: Recent Labs  Lab 03/01/21 2347 03/02/21 0741 03/03/21 0422  WBC 3.3* 3.2* 3.1*  NEUTROABS 2.1  --  1.5*  HGB 14.9 14.1 14.8  HCT 47.1* 43.9 48.1*  MCV 89.0 89.6 91.6  PLT 164 139* 173  Basic Metabolic Panel: Recent Labs  Lab 03/01/21 2347 03/02/21 0741 03/03/21 0422  NA 137  --  142  K 3.3*  --  4.7  CL 97*  --  106  CO2 28  --  27  GLUCOSE 111*  --  156*  BUN 16  --  19  CREATININE 1.02* 0.93 0.87  CALCIUM 8.8*  --  9.2  MG  --  1.5* 2.1  PHOS  --   --  3.3   GFR: Estimated Creatinine Clearance: 77.7 mL/min (by C-G formula based on SCr of 0.87 mg/dL).  Liver Function Tests: Recent Labs  Lab  03/03/21 0422  AST 55*  ALT 53*  ALKPHOS 60  BILITOT 0.7  PROT 7.2  ALBUMIN 3.5     CBG: Recent Labs  Lab 03/02/21 1632  GLUCAP 145*    Recent Results (from the past 240 hour(s))  Resp Panel by RT-PCR (Flu A&B, Covid) Nasopharyngeal Swab     Status: Abnormal   Collection Time: 03/02/21  7:54 AM   Specimen: Nasopharyngeal Swab; Nasopharyngeal(NP) swabs in vial transport medium  Result Value Ref Range Status   SARS Coronavirus 2 by RT PCR POSITIVE (A) NEGATIVE Final    Comment: RESULT CALLED TO, READ BACK BY AND VERIFIED WITH: JACKOB D. ON 03/02/2021 @ 0943 BY MECIAL J. (NOTE) SARS-CoV-2 target nucleic acids are DETECTED.  The SARS-CoV-2 RNA is generally detectable in upper respiratory specimens during the acute phase of infection. Positive results are indicative of the presence of the identified virus, but do not rule out bacterial infection or co-infection with other pathogens not detected by the test. Clinical correlation with patient history and other diagnostic information is necessary to determine patient infection status. The expected result is Negative.  Fact Sheet for Patients: BloggerCourse.com  Fact Sheet for Healthcare Providers: SeriousBroker.it  This test is not yet approved or cleared by the [place] FDA and  has been authorized for detection and/or diagnosis of SARS-CoV-2 by FDA under an Emergency Use Authorization (EUA).  This EUA will remain in effect (meaning this t est can be used) for the duration of  the COVID-19 declaration under Section 564(b)(1) of the Act, 21 U.S.C. section 360bbb-3(b)(1), unless the authorization is terminated or revoked sooner.     Influenza A by PCR NEGATIVE NEGATIVE Final   Influenza B by PCR NEGATIVE NEGATIVE Final    Comment: (NOTE) The Xpert Xpress SARS-CoV-2/FLU/RSV plus assay is intended as an aid in the diagnosis of influenza from Nasopharyngeal swab  specimens and should not be used as a sole basis for treatment. Nasal washings and aspirates are unacceptable for Xpert Xpress SARS-CoV-2/FLU/RSV testing.  Fact Sheet for Patients: BloggerCourse.com  Fact Sheet for Healthcare Providers: SeriousBroker.it  This test is not yet approved or cleared by the [place] FDA and has been authorized for detection and/or diagnosis of SARS-CoV-2 by FDA under an Emergency Use Authorization (EUA). This EUA will remain in effect (meaning this test can be used) for the duration of the COVID-19 declaration under Section 564(b)(1) of the Act, 21 U.S.C. section 360bbb-3(b)(1), unless the authorization is terminated or revoked.  Performed at [facility], [address] [place], [place] [address]      Scheduled Meds:  vitamin C  500 mg Oral Daily   enoxaparin (LOVENOX) injection  40 mg Subcutaneous Q24H   methylPREDNISolone (SOLU-MEDROL) injection  1 mg/kg Intravenous Q12H   Followed by   Melene Muller ON 03/06/2021] predniSONE  50 mg Oral Daily  zinc sulfate  220 mg Oral Daily   Continuous Infusions:  remdesivir 100 mg in NS 100 mL 100 mg (03/03/21 0932)     LOS: 1 day   Lonia Blood, MD [facility] Office  [phone] Pager - Text Page per Loretha Stapler  If 7PM-7AM, please contact night-coverage per Amion 03/03/2021, 10:37 AM

## 2021-03-03 NOTE — Progress Notes (Signed)
Bilateral lower extremity venous duplex completed. Refer to "CV Proc" under chart review to view preliminary results.  03/03/2021 2:36 PM Eula Fried., MHA, RVT, RDCS, RDMS

## 2021-03-04 DIAGNOSIS — J1282 Pneumonia due to coronavirus disease 2019: Secondary | ICD-10-CM

## 2021-03-04 DIAGNOSIS — R739 Hyperglycemia, unspecified: Secondary | ICD-10-CM

## 2021-03-04 LAB — COMPREHENSIVE METABOLIC PANEL
ALT: 47 U/L — ABNORMAL HIGH (ref 0–44)
AST: 39 U/L (ref 15–41)
Albumin: 3.4 g/dL — ABNORMAL LOW (ref 3.5–5.0)
Alkaline Phosphatase: 56 U/L (ref 38–126)
Anion gap: 9 (ref 5–15)
BUN: 29 mg/dL — ABNORMAL HIGH (ref 8–23)
CO2: 28 mmol/L (ref 22–32)
Calcium: 9.1 mg/dL (ref 8.9–10.3)
Chloride: 104 mmol/L (ref 98–111)
Creatinine, Ser: 0.79 mg/dL (ref 0.44–1.00)
GFR, Estimated: >60 mL/min (ref >60)
Glucose, Bld: 152 mg/dL — ABNORMAL HIGH (ref 70–99)
Potassium: 4.7 mmol/L (ref 3.5–5.1)
Sodium: 141 mmol/L (ref 135–145)
Total Bilirubin: 0.6 mg/dL (ref 0.3–1.2)
Total Protein: 6.8 g/dL (ref 6.5–8.1)

## 2021-03-04 LAB — CBC WITH DIFFERENTIAL/PLATELET
Abs Immature Granulocytes: 0.02 10*3/uL (ref 0.00–0.07)
Basophils Absolute: 0 10*3/uL (ref 0.0–0.1)
Basophils Relative: 0 %
Eosinophils Absolute: 0 10*3/uL (ref 0.0–0.5)
Eosinophils Relative: 0 %
HCT: 46.8 % — ABNORMAL HIGH (ref 36.0–46.0)
Hemoglobin: 14.6 g/dL (ref 12.0–15.0)
Immature Granulocytes: 1 %
Lymphocytes Relative: 32 %
Lymphs Abs: 1.4 10*3/uL (ref 0.7–4.0)
MCH: 28.6 pg (ref 26.0–34.0)
MCHC: 31.2 g/dL (ref 30.0–36.0)
MCV: 91.6 fL (ref 80.0–100.0)
Monocytes Absolute: 0.3 10*3/uL (ref 0.1–1.0)
Monocytes Relative: 7 %
Neutro Abs: 2.6 10*3/uL (ref 1.7–7.7)
Neutrophils Relative %: 60 %
Platelets: 213 10*3/uL (ref 150–400)
RBC: 5.11 MIL/uL (ref 3.87–5.11)
RDW: 12.9 % (ref 11.5–15.5)
WBC: 4.3 10*3/uL (ref 4.0–10.5)
nRBC: 0 % (ref 0.0–0.2)

## 2021-03-04 LAB — FERRITIN: Ferritin: 598 ng/mL — ABNORMAL HIGH (ref 11–307)

## 2021-03-04 LAB — D-DIMER, QUANTITATIVE: D-Dimer, Quant: 0.56 ug/mL-FEU — ABNORMAL HIGH (ref 0.00–0.50)

## 2021-03-04 LAB — C-REACTIVE PROTEIN: CRP: 3.8 mg/dL — ABNORMAL HIGH (ref <1.0)

## 2021-03-04 LAB — MAGNESIUM: Magnesium: 2 mg/dL (ref 1.7–2.4)

## 2021-03-04 MED ORDER — ENOXAPARIN SODIUM 60 MG/0.6ML IJ SOSY
60.0000 mg | PREFILLED_SYRINGE | Freq: Every day | INTRAMUSCULAR | Status: DC
  Administered 2021-03-05 – 2021-03-06 (×2): 60 mg via SUBCUTANEOUS
  Filled 2021-03-04 (×2): qty 0.6

## 2021-03-04 NOTE — Evaluation (Signed)
Occupational Therapy Evaluation Patient Details Name: Stacie Logan MRN: [id number] DOB: [birth year] Today's Date: 03/04/2021    History of Present Illness 70yo with a history of obesity and chronic sciatica who presented to the ER with severe generalized weakness and persisting cough after having tested positive for COVID at home February 21, 2021.   Clinical Impression   Patient evaluated by Occupational Therapy with no further acute OT needs identified. All education including enery conservation education has been completed and the patient has no further questions.  See below for any follow-up Occupational Therapy or equipment needs. OT is signing off. Thank you for this referral.     Follow Up Recommendations  No OT follow up    Equipment Recommendations  Tub/shower seat    Recommendations for Other Services       Precautions / Restrictions Precautions Precautions: Fall Precaution Comments: Covid+ Restrictions Weight Bearing Restrictions: No      Mobility Bed Mobility Overal bed mobility: Modified Independent             General bed mobility comments: HOB partially elevated, no use of rails for supine<->sit Mod I.    Transfers Overall transfer level: Modified independent Equipment used: Straight cane             General transfer comment: Stood from EOB: Mod I. Standard toilet transfer Mod I.    Balance Overall balance assessment: Modified Independent                                         ADL either performed or assessed with clinical judgement   ADL Overall ADL's : At baseline                                       General ADL Comments: Pt able to demonstrate bed mobility Mod I, ambulation in room with her own SPC to bathroom, LE dressing sitting and standing, toilet transfer and toileting Mod I and standing at sink for hand hygiene Mod I.     Vision   Vision Assessment?: No apparent visual deficits      Perception     Praxis      Pertinent Vitals/Pain Pain Assessment: No/denies pain     Hand Dominance Right   Extremity/Trunk Assessment Upper Extremity Assessment Upper Extremity Assessment: Overall WFL for tasks assessed   Lower Extremity Assessment Lower Extremity Assessment: Defer to PT evaluation   Cervical / Trunk Assessment Cervical / Trunk Assessment: Normal   Communication Communication Communication: No difficulties   Cognition Arousal/Alertness: Awake/alert Behavior During Therapy: WFL for tasks assessed/performed Overall Cognitive Status: Within Functional Limits for tasks assessed                                 General Comments: A&Ox4   General Comments  Mod I in-room ambulation with pt's SPC which pt uses for pain management for sciatica    Exercises Other Exercises Other Exercises: Pt educated on energy conservation techniques, encouraged to deligate heavier IADLs to others ad lib to avoid over fatigue. Focused on pacing and prioritizing tasks to avoid rehospitalization.   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 Independent at basline with all activities of daily living including shopping, driving and volunteering and "[facility]".        OT Problem List: Decreased activity tolerance      OT Treatment/Interventions:      OT Goals(Current goals can be found in the care plan section) Acute Rehab OT Goals Patient Stated Goal: Resume all  activities independently. OT Goal Formulation: With patient Potential to Achieve Goals: Good ADL Goals Additional ADL Goal #1: Patient will identify at least 3 energy conservation strategies to employ at home in order to maximize function and quality of life and decrease caregiver burden while preventing exacerbation of symptoms and rehospitalization.  OT Frequency:     Barriers to D/C:            Co-evaluation              AM-PAC OT "6 Clicks" Daily Activity     Outcome Measure Help from another person eating meals?: None Help from another person taking care of personal grooming?: None Help from another person toileting, which includes using toliet, bedpan, or urinal?: None Help from another person bathing (including washing, rinsing, drying)?: None Help from another person to put on and taking off regular upper body clothing?: None Help from another person to put on and taking off regular lower body clothing?: None 6 Click Score: 24   End of Session Equipment Utilized During Treatment: Other (comment);Oxygen (Pt's own [facility]) Nurse Communication:  (No further OT needs)  Activity Tolerance: Patient tolerated treatment well Patient left:    OT Visit Diagnosis:  ((difficulty with ADLs: Z73.6))                Time: 6754-4920 OT Time Calculation (min): 37 min Charges:  OT General Charges $OT Visit: 1 Visit OT Evaluation $OT Eval Low Complexity: 1 Low OT Treatments $Self Care/Home Management : 8-22 mins  Stacie Logan, OT Acute Rehab Services Office: [phone] 03/04/2021  Stacie Logan 03/04/2021, 10:15 AM

## 2021-03-04 NOTE — TOC Progression Note (Signed)
Transition of Care [facility]) - Progression Note    Patient Details  Name: Stacie Logan MRN: [id number] Date of Birth: Dec 10, 1950  [facility]) CM/SW Contact  Geni Bers, RN Phone Number: 03/04/2021, 9:50 AM  Clinical Narrative:    Pt is from home alone. TOC will continue to follow for discharge needs.   Expected Discharge Plan: Home/Self Care Barriers to Discharge: No Barriers Identified  Expected Discharge Plan and Services Expected Discharge Plan: Home/Self Care       Living arrangements for the past 2 months: Single Family Home                                       Social Determinants of Health (SDOH) Interventions    Readmission Risk Interventions No flowsheet data found.

## 2021-03-04 NOTE — Progress Notes (Signed)
PROGRESS NOTE  Stacie BachSheila Barnhill UEA:[id number] DOB: [birth year]   PCP: Pcp, No  Patient is from: Home.  DOA: 03/01/2021 LOS: 2  Chief complaints:  Chief Complaint  Patient presents with   Covid Positive     Brief Narrative / Interim history: 70 year old F with PMH of morbid obesity and chronic sciatica who presented to the ER with severe generalized weakness and persisting cough after having tested positive for COVID at home February 21, 2021.  She reported loss of sense of taste and smell, DOE, loss of appetite, lightheadedness, and fatigue.  In the ER desaturated to 70s with ambulation.  CXR noted diffuse pulmonary infiltrates consistent with COVID-pneumonia.  She is unvaccinated due to "personal preference".  Started on steroid and remdesivir.  Subjective: Seen and examined earlier this morning.  No major events overnight of this morning.  She says her breathing is about the same from yesterday.  Reports cough with lying flat.  She attributes this to postnasal drainage.  Endorses DOE.  Denies chest pain, GI or UTI symptoms.  Objective: Vitals:   03/03/21 1227 03/03/21 2228 03/04/21 0629 03/04/21 1252  BP:  (!) 158/72 (!) 175/82 (!) 154/84  Pulse:  61 (!) 52 63  Resp:  18 16 18   Temp:  98.3 F (36.8 C) 97.7 F (36.5 C) 97.8 F (36.6 C)  TempSrc:  Oral Oral Oral  SpO2: 90% 95% 97% 95%  Weight:      Height:        Intake/Output Summary (Last 24 hours) at 03/04/2021 1256 Last data filed at 03/03/2021 2200 Gross per 24 hour  Intake 670.67 ml  Output --  Net 670.67 ml   Filed Weights   03/01/21 2333 03/02/21 1555  Weight: 113.4 kg 116.2 kg    Examination:  GENERAL: No apparent distress.  Nontoxic. HEENT: MMM.  Vision and hearing grossly intact.  NECK: Supple.  No apparent JVD.  RESP:  No IWOB.  Fair aeration bilaterally. CVS:  RRR. Heart sounds normal.  ABD/GI/GU: BS+. Abd soft, NTND.  MSK/EXT:  Moves extremities. No apparent deformity. No edema.  SKIN: no apparent skin  lesion or wound NEURO: Awake, alert and oriented appropriately.  No apparent focal neuro deficit. PSYCH: Calm. Normal affect.   Procedures:  None  Microbiology summarized: 8/2-Home COVID test positive 8/11-COVID-19 PCR positive  Assessment & Plan: Acute respiratory failure with hypoxia due to COVID-19 pneumonia: Tested positive at home 8/2 and here on 8/11.  Desaturated to 70s on RA.  CXR with bilateral infiltrates consistent with COVID-19 infection.  Still with DOE but saturating at 97% on 2 L at rest. Recent Labs    03/02/21 0741 03/02/21 0743 03/03/21 0422 03/04/21 0533  DDIMER 1.09*  --  0.73* 0.56*  FERRITIN  --  634* 614* 598*  LDH 199*  --   --   --   CRP  --  6.3* 7.2* 3.8*  -Continue Solu-Medrol and remdesivir -Subcu Lovenox for VTE prophylaxis-pharmacy to adjust dose for weight -Start p.o. Protonix for GI prophylaxis -Ihalers, mucolytic/antitussive, vitamins, IS. OOB, PT/OT and proning as able while awake -Wean oxygen as able. -Monitor inflammatory markers.  Elevated D-dimer: Likely due to COVID-19 infection.  BLE US negative for DVT. -DVT prophylaxis as above   Hypokalemia/hypomagnesemia: Resolved.   Thrombocytopenia: Likely due to COVID-19 infection.  Resolved. Due to acute COVID illness   Elevated creatinine: Does not meet criteria for AKI.  Improved.  Hyperglycemia: Likely due to steroid. -Check A1c in the morning  Morbid obesity  Body mass index is 42.63 kg/m.  -Encourage lifestyle change to lose weight.       DVT prophylaxis:  SCDs Start: 03/02/21 0742 Subcu Lovenox Code Status: Full code Family Communication: Patient and/or RN. Available if any question.  Level of care: Med-Surg Status is: Inpatient  Remains inpatient appropriate because:IV treatments appropriate due to intensity of illness or inability to take PO and Inpatient level of care appropriate due to severity of illness  Dispo: The patient is from: Home              Anticipated  d/c is to: Home              Patient currently is not medically stable to d/c.   Difficult to place patient No       Consultants:  None   Sch Meds:  Scheduled Meds:  vitamin C  500 mg Oral Daily   [START ON 03/05/2021] enoxaparin (LOVENOX) injection  60 mg Subcutaneous Daily   methylPREDNISolone (SOLU-MEDROL) injection  1 mg/kg Intravenous Q12H   Followed by   Melene Muller ON 03/06/2021] predniSONE  50 mg Oral Daily   zinc sulfate  220 mg Oral Daily   Continuous Infusions:  remdesivir 100 mg in NS 100 mL 100 mg (03/04/21 1055)   PRN Meds:.acetaminophen, guaiFENesin, ondansetron **OR** ondansetron (ZOFRAN) IV  Antimicrobials: Anti-infectives (From admission, onward)    Start     Dose/Rate Route Frequency Ordered Stop   03/03/21 1000  remdesivir 100 mg in sodium chloride 0.9 % 100 mL IVPB       See Hyperspace for full Linked Orders Report.   100 mg 200 mL/hr over 30 Minutes Intravenous Daily 03/02/21 0743 03/07/21 0959   03/02/21 0900  remdesivir 200 mg in sodium chloride 0.9% 250 mL IVPB       See Hyperspace for full Linked Orders Report.   200 mg 580 mL/hr over 30 Minutes Intravenous Once 03/02/21 0743 03/02/21 1034        I have personally reviewed the following labs and images: CBC: Recent Labs  Lab 03/01/21 2347 03/02/21 0741 03/03/21 0422 03/04/21 0533  WBC 3.3* 3.2* 3.1* 4.3  NEUTROABS 2.1  --  1.5* 2.6  HGB 14.9 14.1 14.8 14.6  HCT 47.1* 43.9 48.1* 46.8*  MCV 89.0 89.6 91.6 91.6  PLT 164 139* 173 213   BMP &GFR Recent Labs  Lab 03/01/21 2347 03/02/21 0741 03/03/21 0422 03/04/21 0533  NA 137  --  142 141  K 3.3*  --  4.7 4.7  CL 97*  --  106 104  CO2 28  --  27 28  GLUCOSE 111*  --  156* 152*  BUN 16  --  19 29*  CREATININE 1.02* 0.93 0.87 0.79  CALCIUM 8.8*  --  9.2 9.1  MG  --  1.5* 2.1 2.0  PHOS  --   --  3.3  --    Estimated Creatinine Clearance: 84.6 mL/min (by C-G formula based on SCr of 0.79 mg/dL). Liver & Pancreas: Recent Labs  Lab  03/03/21 0422 03/04/21 0533  AST 55* 39  ALT 53* 47*  ALKPHOS 60 56  BILITOT 0.7 0.6  PROT 7.2 6.8  ALBUMIN 3.5 3.4*   No results for input(s): LIPASE, AMYLASE in the last 168 hours. No results for input(s): AMMONIA in the last 168 hours. Diabetic: No results for input(s): HGBA1C in the last 72 hours. Recent Labs  Lab 03/02/21 1632  GLUCAP 145*   Cardiac Enzymes: No results  for input(s): CKTOTAL, CKMB, CKMBINDEX, TROPONINI in the last 168 hours. No results for input(s): PROBNP in the last 8760 hours. Coagulation Profile: No results for input(s): INR, PROTIME in the last 168 hours. Thyroid Function Tests: No results for input(s): TSH, T4TOTAL, FREET4, T3FREE, THYROIDAB in the last 72 hours. Lipid Profile: No results for input(s): CHOL, HDL, LDLCALC, TRIG, CHOLHDL, LDLDIRECT in the last 72 hours. Anemia Panel: Recent Labs    03/03/21 0422 03/04/21 0533  FERRITIN 614* 598*   Urine analysis: No results found for: COLORURINE, APPEARANCEUR, LABSPEC, PHURINE, GLUCOSEU, HGBUR, BILIRUBINUR, KETONESUR, PROTEINUR, UROBILINOGEN, NITRITE, LEUKOCYTESUR Sepsis Labs: Invalid input(s): PROCALCITONIN, LACTICIDVEN  Microbiology: Recent Results (from the past 240 hour(s))  Resp Panel by RT-PCR (Flu A&B, Covid) Nasopharyngeal Swab     Status: Abnormal   Collection Time: 03/02/21  7:54 AM   Specimen: Nasopharyngeal Swab; Nasopharyngeal(NP) swabs in vial transport medium  Result Value Ref Range Status   SARS Coronavirus 2 by RT PCR POSITIVE (A) NEGATIVE Final    Comment: RESULT CALLED TO, READ BACK BY AND VERIFIED WITH: JACKOB D. ON 03/02/2021 @ 0943 BY MECIAL J. (NOTE) SARS-CoV-2 target nucleic acids are DETECTED.  The SARS-CoV-2 RNA is generally detectable in upper respiratory specimens during the acute phase of infection. Positive results are indicative of the presence of the identified virus, but do not rule out bacterial infection or co-infection with other pathogens  not detected by the test. Clinical correlation with patient history and other diagnostic information is necessary to determine patient infection status. The expected result is Negative.  Fact Sheet for Patients: BloggerCourse.com  Fact Sheet for Healthcare Providers: SeriousBroker.it  This test is not yet approved or cleared by the [place] FDA and  has been authorized for detection and/or diagnosis of SARS-CoV-2 by FDA under an Emergency Use Authorization (EUA).  This EUA will remain in effect (meaning this t est can be used) for the duration of  the COVID-19 declaration under Section 564(b)(1) of the Act, 21 U.S.C. section 360bbb-3(b)(1), unless the authorization is terminated or revoked sooner.     Influenza A by PCR NEGATIVE NEGATIVE Final   Influenza B by PCR NEGATIVE NEGATIVE Final    Comment: (NOTE) The Xpert Xpress SARS-CoV-2/FLU/RSV plus assay is intended as an aid in the diagnosis of influenza from Nasopharyngeal swab specimens and should not be used as a sole basis for treatment. Nasal washings and aspirates are unacceptable for Xpert Xpress SARS-CoV-2/FLU/RSV testing.  Fact Sheet for Patients: BloggerCourse.com  Fact Sheet for Healthcare Providers: SeriousBroker.it  This test is not yet approved or cleared by the [place] FDA and has been authorized for detection and/or diagnosis of SARS-CoV-2 by FDA under an Emergency Use Authorization (EUA). This EUA will remain in effect (meaning this test can be used) for the duration of the COVID-19 declaration under Section 564(b)(1) of the Act, 21 U.S.C. section 360bbb-3(b)(1), unless the authorization is terminated or revoked.  Performed at [facility], [address], [place], [place] [address]     Radiology Studies: VAS [place] LOWER EXTREMITY VENOUS (DVT)  Result Date: 03/03/2021  Lower  Venous DVT Study Patient Name:  Stacie Logan  Date of Exam:   03/03/2021 Medical Rec #: [id number]      Accession #:    [id number] Date of Birth: July 09, 1951      Patient Gender: F Patient Age:   68 years Exam Location:  [facility] Procedure:      VAS [place] LOWER EXTREMITY VENOUS (DVT)  Referring Phys: JEFFREY MCCLUNG --------------------------------------------------------------------------------  Indications: COVID, elevated d-dimer.  Comparison Study: No prior study Performing Technologist: Gertie Fey MHA, RDMS, RVT, RDCS  Examination Guidelines: A complete evaluation includes B-mode imaging, spectral Doppler, color Doppler, and power Doppler as needed of all accessible portions of each vessel. Bilateral testing is considered an integral part of a complete examination. Limited examinations for reoccurring indications may be performed as noted. The reflux portion of the exam is performed with the patient in reverse Trendelenburg.  +---------+---------------+---------+-----------+----------+--------------+ RIGHT    CompressibilityPhasicitySpontaneityPropertiesThrombus Aging +---------+---------------+---------+-----------+----------+--------------+ CFV      Full           Yes      Yes                                 +---------+---------------+---------+-----------+----------+--------------+ SFJ      Full                                                        +---------+---------------+---------+-----------+----------+--------------+ FV Prox  Full                                                        +---------+---------------+---------+-----------+----------+--------------+ FV Mid   Full                                                        +---------+---------------+---------+-----------+----------+--------------+ FV DistalFull                                                        +---------+---------------+---------+-----------+----------+--------------+  PFV      Full                                                        +---------+---------------+---------+-----------+----------+--------------+ POP      Full           Yes      Yes                                 +---------+---------------+---------+-----------+----------+--------------+ PTV      Full                                                        +---------+---------------+---------+-----------+----------+--------------+ PERO     Full                                                        +---------+---------------+---------+-----------+----------+--------------+   +---------+---------------+---------+-----------+----------+--------------+  LEFT     CompressibilityPhasicitySpontaneityPropertiesThrombus Aging +---------+---------------+---------+-----------+----------+--------------+ CFV      Full           Yes      Yes                                 +---------+---------------+---------+-----------+----------+--------------+ SFJ      Full                                                        +---------+---------------+---------+-----------+----------+--------------+ FV Prox  Full                                                        +---------+---------------+---------+-----------+----------+--------------+ FV Mid   Full                                                        +---------+---------------+---------+-----------+----------+--------------+ FV DistalFull                                                        +---------+---------------+---------+-----------+----------+--------------+ PFV      Full                                                        +---------+---------------+---------+-----------+----------+--------------+ POP      Full           Yes      Yes                                 +---------+---------------+---------+-----------+----------+--------------+ PTV      Full                                                         +---------+---------------+---------+-----------+----------+--------------+ PERO     Full                                                        +---------+---------------+---------+-----------+----------+--------------+    Summary: BILATERAL: - No evidence of deep vein thrombosis seen in the lower extremities, bilaterally. -No evidence of popliteal cyst, bilaterally.   *See table(s) above for measurements and observations.    Preliminary       Calla Wedekind T. Briannie Gutierrez [facility]ist  If 7PM-7AM, please contact night-coverage [email or web address] 03/04/2021, 12:56 PM

## 2021-03-04 NOTE — Progress Notes (Signed)
Pt sitting on EOB and up in chair several times this shift. Pt independent in room and able to transfer herself. O2 Sats remaining stable on 2L/[place]. Pt has also done IS multiple times, only able to get to 500 max. Continued use encouraged.

## 2021-03-04 NOTE — Evaluation (Signed)
Physical Therapy Evaluation & discharge Patient Details Name: Stacie Logan MRN: [id number] DOB: June 10, 1951 Today's Date: 03/04/2021   History of Present Illness  70yo with a history of obesity and chronic sciatica who presented to the ER with severe generalized weakness and persisting cough after having tested positive for COVID at home February 21, 2021.  Clinical Impression  Pt is at baseline level of functioning with her cane.  She is ambulating around her room without A.  She was educated on pursed lip breathing with any SOB.  No further PT needs identified and pt in agreement.    Follow Up Recommendations No PT follow up    Equipment Recommendations  None recommended by PT    Recommendations for Other Services       Precautions / Restrictions Precautions Precaution Comments: Covid+ Restrictions Weight Bearing Restrictions: No      Mobility  Bed Mobility               General bed mobility comments: sitting EOB    Transfers Overall transfer level: Modified independent Equipment used: Straight cane             General transfer comment: Transferred without A  Ambulation/Gait Ambulation/Gait assistance: Modified independent (Device/Increase time) Gait Distance (Feet): 30 Feet Assistive device: Straight cane Gait Pattern/deviations: Antalgic Gait velocity: decreased   General Gait Details: Antalgic gait with cane, which pt reports is her baseline.  Amb on RA and o2 dropped to 87%.  With pursed lip breathing increased to 92% within 2 minutes.  Stairs            Wheelchair Mobility    Modified Rankin (Stroke Patients Only)       Balance Overall balance assessment: Modified Independent                                           Pertinent Vitals/Pain Pain Assessment: No/denies pain    Home Living Family/patient expects to be discharged to:: Private residence Living Arrangements: Alone   Type of Home: Apartment (ground floor  condo) Home Access: Level entry     Home Layout: One level Home Equipment: Cane - single point      Prior Function Level of Independence: Independent with assistive device(s)         Comments: Pt reports baseline ambulation with SPC due to siatica. Independent at basline with all activities of daily living including shopping, driving and volunteering and "[facility]".     Hand Dominance   Dominant Hand: Right    Extremity/Trunk Assessment   Upper Extremity Assessment Upper Extremity Assessment: Defer to OT evaluation    Lower Extremity Assessment Lower Extremity Assessment: Overall WFL for tasks assessed;RLE deficits/detail RLE Deficits / Details: c/o sciatica in R LE       Communication   Communication: No difficulties  Cognition Arousal/Alertness: Awake/alert Behavior During Therapy: WFL for tasks assessed/performed Overall Cognitive Status: Within Functional Limits for tasks assessed                                 General Comments: A&Ox4      General Comments      Exercises     Assessment/Plan    PT Assessment Patent does not need any further PT services  PT Problem List  PT Treatment Interventions      PT Goals (Current goals can be found in the Care Plan section)  Acute Rehab PT Goals PT Goal Formulation: All assessment and education complete, DC therapy    Frequency     Barriers to discharge        Co-evaluation               AM-PAC PT "6 Clicks" Mobility  Outcome Measure Help needed turning from your back to your side while in a flat bed without using bedrails?: None Help needed moving from lying on your back to sitting on the side of a flat bed without using bedrails?: None Help needed moving to and from a bed to a chair (including a wheelchair)?: None Help needed standing up from a chair using your arms (e.g., wheelchair or bedside chair)?: None Help needed to walk in hospital room?: None Help needed  climbing 3-5 steps with a railing? : A Little 6 Click Score: 23    End of Session   Activity Tolerance: Patient tolerated treatment well Patient left: in bed Nurse Communication: Mobility status PT Visit Diagnosis: Muscle weakness (generalized) (M62.81)    Time: 3968-8648 PT Time Calculation (min) (ACUTE ONLY): 18 min   Charges:   PT Evaluation $PT Eval Low Complexity: 1 Low          Stacie Logan, PT  03/04/2021   Stacie Logan 03/04/2021, 3:22 PM

## 2021-03-05 ENCOUNTER — Encounter (HOSPITAL_COMMUNITY): Payer: Self-pay | Admitting: Internal Medicine

## 2021-03-05 DIAGNOSIS — R030 Elevated blood-pressure reading, without diagnosis of hypertension: Secondary | ICD-10-CM

## 2021-03-05 LAB — COMPREHENSIVE METABOLIC PANEL
ALT: 61 U/L — ABNORMAL HIGH (ref 0–44)
AST: 54 U/L — ABNORMAL HIGH (ref 15–41)
Albumin: 3.2 g/dL — ABNORMAL LOW (ref 3.5–5.0)
Alkaline Phosphatase: 56 U/L (ref 38–126)
Anion gap: 7 (ref 5–15)
BUN: 27 mg/dL — ABNORMAL HIGH (ref 8–23)
CO2: 29 mmol/L (ref 22–32)
Calcium: 8.7 mg/dL — ABNORMAL LOW (ref 8.9–10.3)
Chloride: 104 mmol/L (ref 98–111)
Creatinine, Ser: 0.75 mg/dL (ref 0.44–1.00)
GFR, Estimated: >60 mL/min (ref >60)
Glucose, Bld: 175 mg/dL — ABNORMAL HIGH (ref 70–99)
Potassium: 4.4 mmol/L (ref 3.5–5.1)
Sodium: 140 mmol/L (ref 135–145)
Total Bilirubin: 0.5 mg/dL (ref 0.3–1.2)
Total Protein: 6.5 g/dL (ref 6.5–8.1)

## 2021-03-05 LAB — CBC WITH DIFFERENTIAL/PLATELET
Abs Immature Granulocytes: 0.02 10*3/uL (ref 0.00–0.07)
Basophils Absolute: 0 10*3/uL (ref 0.0–0.1)
Basophils Relative: 0 %
Eosinophils Absolute: 0 10*3/uL (ref 0.0–0.5)
Eosinophils Relative: 0 %
HCT: 47.8 % — ABNORMAL HIGH (ref 36.0–46.0)
Hemoglobin: 14.9 g/dL (ref 12.0–15.0)
Immature Granulocytes: 0 %
Lymphocytes Relative: 23 %
Lymphs Abs: 1.1 10*3/uL (ref 0.7–4.0)
MCH: 28.4 pg (ref 26.0–34.0)
MCHC: 31.2 g/dL (ref 30.0–36.0)
MCV: 91.2 fL (ref 80.0–100.0)
Monocytes Absolute: 0.2 10*3/uL (ref 0.1–1.0)
Monocytes Relative: 4 %
Neutro Abs: 3.5 10*3/uL (ref 1.7–7.7)
Neutrophils Relative %: 73 %
Platelets: 225 10*3/uL (ref 150–400)
RBC: 5.24 MIL/uL — ABNORMAL HIGH (ref 3.87–5.11)
RDW: 12.6 % (ref 11.5–15.5)
WBC: 4.9 10*3/uL (ref 4.0–10.5)
nRBC: 0 % (ref 0.0–0.2)

## 2021-03-05 LAB — HEMOGLOBIN A1C
Hgb A1c MFr Bld: 5.8 % — ABNORMAL HIGH (ref 4.8–5.6)
Mean Plasma Glucose: 119.76 mg/dL

## 2021-03-05 LAB — FERRITIN: Ferritin: 669 ng/mL — ABNORMAL HIGH (ref 11–307)

## 2021-03-05 LAB — MAGNESIUM: Magnesium: 2 mg/dL (ref 1.7–2.4)

## 2021-03-05 LAB — C-REACTIVE PROTEIN: CRP: 2.3 mg/dL — ABNORMAL HIGH (ref <1.0)

## 2021-03-05 LAB — D-DIMER, QUANTITATIVE: D-Dimer, Quant: 0.48 ug/mL-FEU (ref 0.00–0.50)

## 2021-03-05 MED ORDER — LOSARTAN POTASSIUM 25 MG PO TABS
25.0000 mg | ORAL_TABLET | Freq: Every day | ORAL | Status: DC
  Administered 2021-03-05 – 2021-03-06 (×2): 25 mg via ORAL
  Filled 2021-03-05 (×2): qty 1

## 2021-03-05 MED ORDER — HYDRALAZINE HCL 25 MG PO TABS
25.0000 mg | ORAL_TABLET | Freq: Four times a day (QID) | ORAL | Status: DC | PRN
  Administered 2021-03-05: 25 mg via ORAL
  Filled 2021-03-05: qty 1

## 2021-03-05 NOTE — Progress Notes (Signed)
Pt O2 requirement evaluation on at rest and with exertion:   Pt O2 Sat on RA at rest 89%  Pt O2 Sat on RA with exertion 85%  Pt O2 sat on 2L [place] at rest 94%  Pt O2 sat on 2L [place] with exertion 92%

## 2021-03-05 NOTE — Progress Notes (Signed)
PROGRESS NOTE  Stacie Logan DJM:[id number] DOB: 09/29/1950   PCP: Pcp, No  Patient is from: Home.  DOA: 03/01/2021 LOS: 3  Chief complaints:  Chief Complaint  Patient presents with   Covid Positive     Brief Narrative / Interim history: 70 year old F with PMH of morbid obesity and chronic sciatica who presented to the ER with severe generalized weakness and persisting cough after having tested positive for COVID at home February 21, 2021.  She reported loss of sense of taste and smell, DOE, loss of appetite, lightheadedness, and fatigue.  In the ER desaturated to 70s with ambulation.  CXR noted diffuse pulmonary infiltrates consistent with COVID-pneumonia.  She is unvaccinated due to "personal preference".  Started on steroid and remdesivir.  Subjective: Seen and examined earlier this morning.  No major events overnight of this morning.  She states her breathing is about the same from yesterday.  Reports cough in the morning hours.  She denies chest pain.  Denies GI or UTI symptoms.  Objective: Vitals:   03/04/21 1252 03/04/21 2044 03/05/21 0530 03/05/21 1247  BP: (!) 154/84 (!) 160/72 (!) 180/88 (!) 183/84  Pulse: 63 61 (!) 56 (!) 56  Resp: 18 20 18 18   Temp: 97.8 F (36.6 C) 97.8 F (36.6 C) 97.6 F (36.4 C) 97.6 F (36.4 C)  TempSrc: Oral Oral Oral Oral  SpO2: 95% 95% 97% 97%  Weight:      Height:        Intake/Output Summary (Last 24 hours) at 03/05/2021 1528 Last data filed at 03/05/2021 1000 Gross per 24 hour  Intake 240 ml  Output --  Net 240 ml   Filed Weights   03/01/21 2333 03/02/21 1555  Weight: 113.4 kg 116.2 kg    Examination:  GENERAL: No apparent distress.  Nontoxic. HEENT: MMM.  Vision and hearing grossly intact.  NECK: Supple.  No apparent JVD.  RESP: 97% on 2 L.  No IWOB.  Diminished aeration bilaterally. CVS:  RRR. Heart sounds normal.  ABD/GI/GU: BS+. Abd soft, NTND.  MSK/EXT:  Moves extremities. No apparent deformity. No edema.  SKIN: no  apparent skin lesion or wound NEURO: Awake and alert. Oriented appropriately.  No apparent focal neuro deficit. PSYCH: Calm. Normal affect.   Procedures:  None  Microbiology summarized: 8/2-Home COVID test positive 8/11-COVID-19 PCR positive  Assessment & Plan: Acute respiratory failure with hypoxia due to COVID-19 pneumonia: Tested positive at home 8/2 and here on 8/11.  Desaturated to 70s on RA.  CXR with bilateral infiltrates consistent with COVID-19 infection.  Still with dyspnea, cough and oxygen requirement.  Desaturated to 85% with ambulation on RA. Recent Labs    03/03/21 0422 03/04/21 0533 03/05/21 0426  DDIMER 0.73* 0.56* 0.48  FERRITIN 614* 598* 669*  CRP 7.2* 3.8* 2.3*  -Continue Solu-Medrol and remdesivir -Subcu Lovenox for VTE prophylaxis-pharmacy to adjust dose for weight -Continue Protonix for GI prophylaxis -Ihalers, mucolytic/antitussive, vitamins, IS. OOB, PT/OT and proning as able while awake -Wean oxygen as able. -Monitor inflammatory markers.  Elevated blood pressure: No history of HTN.   -Low-dose losartan -P.o. hydralazine 25 mg every 6 hours as needed with parameters  Elevated D-dimer: Likely due to COVID-19 infection.  BLE 03/07/21 negative for DVT. -DVT prophylaxis as above   Hypokalemia/hypomagnesemia: Resolved.   Thrombocytopenia: Likely due to COVID-19 infection.  Resolved. Due to acute COVID illness   Elevated creatinine: Does not meet criteria for AKI.  Improved.  Hyperglycemia: Likely due to steroid. -Check A1c in  the morning  Morbid obesity Body mass index is 42.63 kg/m.  -Encourage lifestyle change to lose weight.       DVT prophylaxis:  SCDs Start: 03/02/21 0742 Subcu Lovenox Code Status: Full code Family Communication: Patient and/or RN. Available if any question.  Level of care: Med-Surg Status is: Inpatient  Remains inpatient appropriate because:IV treatments appropriate due to intensity of illness or inability to take PO  and Inpatient level of care appropriate due to severity of illness  Dispo: The patient is from: Home              Anticipated d/c is to: Home              Patient currently is not medically stable to d/c.   Difficult to place patient No       Consultants:  None   Sch Meds:  Scheduled Meds:  vitamin C  500 mg Oral Daily   enoxaparin (LOVENOX) injection  60 mg Subcutaneous Daily   losartan  25 mg Oral Daily   [START ON 03/06/2021] predniSONE  50 mg Oral Daily   zinc sulfate  220 mg Oral Daily   Continuous Infusions:  remdesivir 100 mg in NS 100 mL 100 mg (03/05/21 1059)   PRN Meds:.acetaminophen, guaiFENesin, hydrALAZINE, ondansetron **OR** ondansetron (ZOFRAN) IV  Antimicrobials: Anti-infectives (From admission, onward)    Start     Dose/Rate Route Frequency Ordered Stop   03/03/21 1000  remdesivir 100 mg in sodium chloride 0.9 % 100 mL IVPB       See Hyperspace for full Linked Orders Report.   100 mg 200 mL/hr over 30 Minutes Intravenous Daily 03/02/21 0743 03/07/21 0959   03/02/21 0900  remdesivir 200 mg in sodium chloride 0.9% 250 mL IVPB       See Hyperspace for full Linked Orders Report.   200 mg 580 mL/hr over 30 Minutes Intravenous Once 03/02/21 0743 03/02/21 1034        I have personally reviewed the following labs and images: CBC: Recent Labs  Lab 03/01/21 2347 03/02/21 0741 03/03/21 0422 03/04/21 0533 03/05/21 0426  WBC 3.3* 3.2* 3.1* 4.3 4.9  NEUTROABS 2.1  --  1.5* 2.6 3.5  HGB 14.9 14.1 14.8 14.6 14.9  HCT 47.1* 43.9 48.1* 46.8* 47.8*  MCV 89.0 89.6 91.6 91.6 91.2  PLT 164 139* 173 213 225   BMP &GFR Recent Labs  Lab 03/01/21 2347 03/02/21 0741 03/03/21 0422 03/04/21 0533 03/05/21 0426  NA 137  --  142 141 140  K 3.3*  --  4.7 4.7 4.4  CL 97*  --  106 104 104  CO2 28  --  27 28 29   GLUCOSE 111*  --  156* 152* 175*  BUN 16  --  19 29* 27*  CREATININE 1.02* 0.93 0.87 0.79 0.75  CALCIUM 8.8*  --  9.2 9.1 8.7*  MG  --  1.5* 2.1 2.0  2.0  PHOS  --   --  3.3  --   --    Estimated Creatinine Clearance: 84.6 mL/min (by C-G formula based on SCr of 0.75 mg/dL). Liver & Pancreas: Recent Labs  Lab 03/03/21 0422 03/04/21 0533 03/05/21 0426  AST 55* 39 54*  ALT 53* 47* 61*  ALKPHOS 60 56 56  BILITOT 0.7 0.6 0.5  PROT 7.2 6.8 6.5  ALBUMIN 3.5 3.4* 3.2*   No results for input(s): LIPASE, AMYLASE in the last 168 hours. No results for input(s): AMMONIA in the last 168  hours. Diabetic: Recent Labs    03/05/21 0426  HGBA1C 5.8*   Recent Labs  Lab 03/02/21 1632  GLUCAP 145*   Cardiac Enzymes: No results for input(s): CKTOTAL, CKMB, CKMBINDEX, TROPONINI in the last 168 hours. No results for input(s): PROBNP in the last 8760 hours. Coagulation Profile: No results for input(s): INR, PROTIME in the last 168 hours. Thyroid Function Tests: No results for input(s): TSH, T4TOTAL, FREET4, T3FREE, THYROIDAB in the last 72 hours. Lipid Profile: No results for input(s): CHOL, HDL, LDLCALC, TRIG, CHOLHDL, LDLDIRECT in the last 72 hours. Anemia Panel: Recent Labs    03/04/21 0533 03/05/21 0426  FERRITIN 598* 669*   Urine analysis: No results found for: COLORURINE, APPEARANCEUR, LABSPEC, PHURINE, GLUCOSEU, HGBUR, BILIRUBINUR, KETONESUR, PROTEINUR, UROBILINOGEN, NITRITE, LEUKOCYTESUR Sepsis Labs: Invalid input(s): PROCALCITONIN, LACTICIDVEN  Microbiology: Recent Results (from the past 240 hour(s))  Resp Panel by RT-PCR (Flu A&B, Covid) Nasopharyngeal Swab     Status: Abnormal   Collection Time: 03/02/21  7:54 AM   Specimen: Nasopharyngeal Swab; Nasopharyngeal(NP) swabs in vial transport medium  Result Value Ref Range Status   SARS Coronavirus 2 by RT PCR POSITIVE (A) NEGATIVE Final    Comment: RESULT CALLED TO, READ BACK BY AND VERIFIED WITH: JACKOB D. ON 03/02/2021 @ 0943 BY MECIAL J. (NOTE) SARS-CoV-2 target nucleic acids are DETECTED.  The SARS-CoV-2 RNA is generally detectable in upper respiratory specimens  during the acute phase of infection. Positive results are indicative of the presence of the identified virus, but do not rule out bacterial infection or co-infection with other pathogens not detected by the test. Clinical correlation with patient history and other diagnostic information is necessary to determine patient infection status. The expected result is Negative.  Fact Sheet for Patients: BloggerCourse.com  Fact Sheet for Healthcare Providers: SeriousBroker.it  This test is not yet approved or cleared by the [place] FDA and  has been authorized for detection and/or diagnosis of SARS-CoV-2 by FDA under an Emergency Use Authorization (EUA).  This EUA will remain in effect (meaning this t est can be used) for the duration of  the COVID-19 declaration under Section 564(b)(1) of the Act, 21 U.S.C. section 360bbb-3(b)(1), unless the authorization is terminated or revoked sooner.     Influenza A by PCR NEGATIVE NEGATIVE Final   Influenza B by PCR NEGATIVE NEGATIVE Final    Comment: (NOTE) The Xpert Xpress SARS-CoV-2/FLU/RSV plus assay is intended as an aid in the diagnosis of influenza from Nasopharyngeal swab specimens and should not be used as a sole basis for treatment. Nasal washings and aspirates are unacceptable for Xpert Xpress SARS-CoV-2/FLU/RSV testing.  Fact Sheet for Patients: BloggerCourse.com  Fact Sheet for Healthcare Providers: SeriousBroker.it  This test is not yet approved or cleared by the [place] FDA and has been authorized for detection and/or diagnosis of SARS-CoV-2 by FDA under an Emergency Use Authorization (EUA). This EUA will remain in effect (meaning this test can be used) for the duration of the COVID-19 declaration under Section 564(b)(1) of the Act, 21 U.S.C. section 360bbb-3(b)(1), unless the authorization is terminated  or revoked.  Performed at [facility], [address], [place], [place] [address]     Radiology Studies: No results found.    Fontaine Hehl T. Danali Marinos [facility]  If 7PM-7AM, please contact night-coverage [email or web address] 03/05/2021, 3:28 PM

## 2021-03-06 DIAGNOSIS — R748 Abnormal levels of other serum enzymes: Secondary | ICD-10-CM

## 2021-03-06 LAB — CBC WITH DIFFERENTIAL/PLATELET
Abs Immature Granulocytes: 0.04 10*3/uL (ref 0.00–0.07)
Basophils Absolute: 0 10*3/uL (ref 0.0–0.1)
Basophils Relative: 0 %
Eosinophils Absolute: 0 10*3/uL (ref 0.0–0.5)
Eosinophils Relative: 0 %
HCT: 47.4 % — ABNORMAL HIGH (ref 36.0–46.0)
Hemoglobin: 14.8 g/dL (ref 12.0–15.0)
Immature Granulocytes: 1 %
Lymphocytes Relative: 20 %
Lymphs Abs: 1.3 10*3/uL (ref 0.7–4.0)
MCH: 28.5 pg (ref 26.0–34.0)
MCHC: 31.2 g/dL (ref 30.0–36.0)
MCV: 91.2 fL (ref 80.0–100.0)
Monocytes Absolute: 0.6 10*3/uL (ref 0.1–1.0)
Monocytes Relative: 8 %
Neutro Abs: 4.7 10*3/uL (ref 1.7–7.7)
Neutrophils Relative %: 71 %
Platelets: 222 10*3/uL (ref 150–400)
RBC: 5.2 MIL/uL — ABNORMAL HIGH (ref 3.87–5.11)
RDW: 12.5 % (ref 11.5–15.5)
WBC: 6.7 10*3/uL (ref 4.0–10.5)
nRBC: 0 % (ref 0.0–0.2)

## 2021-03-06 LAB — COMPREHENSIVE METABOLIC PANEL
ALT: 109 U/L — ABNORMAL HIGH (ref 0–44)
AST: 90 U/L — ABNORMAL HIGH (ref 15–41)
Albumin: 3.2 g/dL — ABNORMAL LOW (ref 3.5–5.0)
Alkaline Phosphatase: 58 U/L (ref 38–126)
Anion gap: 7 (ref 5–15)
BUN: 29 mg/dL — ABNORMAL HIGH (ref 8–23)
CO2: 28 mmol/L (ref 22–32)
Calcium: 8.7 mg/dL — ABNORMAL LOW (ref 8.9–10.3)
Chloride: 106 mmol/L (ref 98–111)
Creatinine, Ser: 0.82 mg/dL (ref 0.44–1.00)
GFR, Estimated: >60 mL/min (ref >60)
Glucose, Bld: 138 mg/dL — ABNORMAL HIGH (ref 70–99)
Potassium: 3.8 mmol/L (ref 3.5–5.1)
Sodium: 141 mmol/L (ref 135–145)
Total Bilirubin: 0.6 mg/dL (ref 0.3–1.2)
Total Protein: 6.4 g/dL — ABNORMAL LOW (ref 6.5–8.1)

## 2021-03-06 LAB — MAGNESIUM: Magnesium: 1.9 mg/dL (ref 1.7–2.4)

## 2021-03-06 LAB — C-REACTIVE PROTEIN: CRP: 2.1 mg/dL — ABNORMAL HIGH (ref <1.0)

## 2021-03-06 LAB — D-DIMER, QUANTITATIVE: D-Dimer, Quant: 0.51 ug/mL-FEU — ABNORMAL HIGH (ref 0.00–0.50)

## 2021-03-06 LAB — FERRITIN: Ferritin: 634 ng/mL — ABNORMAL HIGH (ref 11–307)

## 2021-03-06 MED ORDER — GUAIFENESIN ER 600 MG PO TB12: 600.0000 mg | ORAL_TABLET | Freq: Two times a day (BID) | ORAL | Status: DC | PRN

## 2021-03-06 MED ORDER — LOSARTAN POTASSIUM 50 MG PO TABS: 50.0000 mg | ORAL_TABLET | Freq: Every day | ORAL | 1 refills | Status: DC

## 2021-03-06 MED ORDER — PREDNISONE 50 MG PO TABS: 50.0000 mg | ORAL_TABLET | Freq: Every day | ORAL | 0 refills | Status: DC

## 2021-03-06 NOTE — Discharge Summary (Signed)
Physician Discharge Summary  Stacie Logan VWU:[id number] DOB: 07/03/1951 DOA: 03/01/2021  PCP: Pcp, No  Admit date: 03/01/2021 Discharge date: 03/06/2021  Admitted From: Home Disposition: Home  Recommendations for Outpatient Follow-up:  Follow ups as below. Please obtain CBC/BMP/Mag at follow up Recheck blood pressure at follow-up. Please follow up on the following pending results: None  Home Health: Not indicated Equipment/Devices: Home oxygen, 2 L  Discharge Condition: Stable CODE STATUS: Full code   Hospital Course: 70 year old F with PMH of morbid obesity and chronic sciatica who presented to the ER with severe generalized weakness and persisting cough after having tested positive for COVID at home February 21, 2021.  She reported loss of sense of taste and smell, DOE, loss of appetite, lightheadedness, and fatigue.  In the ER desaturated to 70s with ambulation.  CXR noted diffuse pulmonary infiltrates consistent with COVID-pneumonia.  She is unvaccinated due to "personal preference".  Started on steroid and remdesivir.  Patient completed 4 days of remdesivir.  Continued on IV Solu-Medrol and transition to p.o. prednisone.  Breathing improved but not able to liberate off oxygen completely.  She required 2 L with exertion to maintain appropriate saturation.  She is discharged on home oxygen and p.o. prednisone for 4 more days.  Encouraged to establish care with PCP.   See individual problem list below for more on hospital course.  Discharge Diagnoses:  Acute respiratory failure with hypoxia due to COVID-19 pneumonia: Tested positive at home 8/2 and here on 8/11.  Desaturated to 70s on RA.  CXR with bilateral infiltrates consistent with COVID-19 infection.  Respiratory failure improved.  Desaturated to 87% with ambulation on room air but recovered to 91% on 2 L by [place]. -Discharged with home oxygen, 2 L -P.o. prednisone 50 mg daily for 4 more days   Elevated blood pressure: No  history of HTN.   -Discharged on losartan 50 mg daily -Check BP and renal function in 1 to 2 weeks   Elevated D-dimer: Likely due to COVID-19 infection.  BLE [place] negative for DVT.   Hypokalemia/hypomagnesemia: Resolved.   Thrombocytopenia: Likely due to COVID-19 infection.  Resolved.   Elevated creatinine: Does not meet criteria for AKI.  Resolved.  Elevated liver enzymes: Likely from COVID and remdesivir.  Could have fatty liver disease. -Recheck CMP at follow-up.   Hyperglycemia: Likely due to steroid.  A1c 5.8%.    Morbid obesity Body mass index is 42.63 kg/m.  -Encourage lifestyle change to lose weight.          Discharge Exam: Vitals:   03/06/21 0634 03/06/21 1256  BP:  (!) 159/74  Pulse:  (!) 57  Resp:  14  Temp: 97.7 F (36.5 C) (!) 97.5 F (36.4 C)  SpO2: 97% 98%    GENERAL: No apparent distress.  Nontoxic. HEENT: MMM.  Vision and hearing grossly intact.  NECK: Supple.  No apparent JVD.  RESP:  No IWOB.  Fair aeration bilaterally. CVS:  RRR. Heart sounds normal.  ABD/GI/GU: Bowel sounds present. Soft. Non tender.  MSK/EXT:  Moves extremities. No apparent deformity. No edema.  SKIN: no apparent skin lesion or wound NEURO: Awake, alert and oriented appropriately.  No apparent focal neuro deficit. PSYCH: Calm. Normal affect.   Discharge Instructions  Discharge Instructions     Call MD for:  difficulty breathing, headache or visual disturbances   Complete by: As directed    Call MD for:  extreme fatigue   Complete by: As directed    Call MD  for:  persistant dizziness or light-headedness   Complete by: As directed    Diet - low sodium heart healthy   Complete by: As directed    Discharge instructions   Complete by: As directed    It has been a pleasure taking care of you!  You were hospitalized due to COVID-19 infection/pneumonia.  Your symptoms improved with treatment.  We are discharging you on more prednisone for the next 4 days.  Due to the  severity of illness, you are considered contagious for 21 days after your initial test.  We strongly recommend you take precaution to limit the spread of the virus.  We strongly recommend using appropriate mask and hand sanitizers when around family members or other people.  We have also started you on blood pressure medications.  Please establish care with a primary care doctor as soon as possible.    Take care,   Increase activity slowly   Complete by: As directed       Allergies as of 03/06/2021   No Known Allergies      Medication List     STOP taking these medications    ibuprofen 200 MG tablet Commonly known as: ADVIL       TAKE these medications    co-enzyme Q-10 30 MG capsule Take 30 mg by mouth 3 (three) times daily.   guaiFENesin 600 MG 12 hr tablet Commonly known as: MUCINEX Take 1 tablet (600 mg total) by mouth 2 (two) times daily as needed for cough.   losartan 50 MG tablet Commonly known as: COZAAR Take 1 tablet (50 mg total) by mouth daily.   multivitamin with minerals tablet Take 1 tablet by mouth daily. What changed: Another medication with the same name was removed. Continue taking this medication, and follow the directions you see here.   predniSONE 50 MG tablet Commonly known as: DELTASONE Take 1 tablet (50 mg total) by mouth daily with breakfast. Start taking on: March 07, 2021   zinc gluconate 50 MG tablet Take 50 mg by mouth daily.               Durable Medical Equipment  (From admission, onward)           Start     Ordered   03/06/21 1148  For home use only DME oxygen  Once       Question Answer Comment  Length of Need 6 Months   Mode or (Route) Nasal cannula   Liters per Minute 2   Frequency Continuous (stationary and portable oxygen unit needed)   Oxygen delivery system Gas      03/06/21 1147            Consultations: None  Procedures/Studies:  DG Chest Portable 1 View  Result Date:  03/01/2021 CLINICAL DATA:  Cough and body ache COVID EXAM: PORTABLE CHEST 1 VIEW COMPARISON:  None. FINDINGS: Mild cardiomegaly. Streaky left greater than right perihilar opacity. No pleural effusion or pneumothorax IMPRESSION: 1. Streaky left greater than right perihilar opacity suspicious for pneumonia 2. Mild cardiomegaly Electronically Signed   By: Jasmine PangKim  Fujinaga M.D.   On: 03/01/2021 23:51   VAS US LOWER EXTREMITY VENOUS (DVT)  Result Date: 03/04/2021  Lower Venous DVT Study Patient Name:  Stacie BachSHEILA Kinzler  Date of Exam:   03/03/2021 Medical Rec #: [id number]      Accession #:    [id number] Date of Birth: [birth year]      Patient Gender: F Patient Age:  70 years Exam Location:  [facility] Procedure:      VAS [place] LOWER EXTREMITY VENOUS (DVT) Referring Phys: JEFFREY MCCLUNG --------------------------------------------------------------------------------  Indications: COVID, elevated d-dimer.  Comparison Study: No prior study Performing Technologist: Gertie Fey MHA, RDMS, RVT, RDCS  Examination Guidelines: A complete evaluation includes B-mode imaging, spectral Doppler, color Doppler, and power Doppler as needed of all accessible portions of each vessel. Bilateral testing is considered an integral part of a complete examination. Limited examinations for reoccurring indications may be performed as noted. The reflux portion of the exam is performed with the patient in reverse Trendelenburg.  +---------+---------------+---------+-----------+----------+--------------+ RIGHT    CompressibilityPhasicitySpontaneityPropertiesThrombus Aging +---------+---------------+---------+-----------+----------+--------------+ CFV      Full           Yes      Yes                                 +---------+---------------+---------+-----------+----------+--------------+ SFJ      Full                                                         +---------+---------------+---------+-----------+----------+--------------+ FV Prox  Full                                                        +---------+---------------+---------+-----------+----------+--------------+ FV Mid   Full                                                        +---------+---------------+---------+-----------+----------+--------------+ FV DistalFull                                                        +---------+---------------+---------+-----------+----------+--------------+ PFV      Full                                                        +---------+---------------+---------+-----------+----------+--------------+ POP      Full           Yes      Yes                                 +---------+---------------+---------+-----------+----------+--------------+ PTV      Full                                                        +---------+---------------+---------+-----------+----------+--------------+ PERO  Full                                                        +---------+---------------+---------+-----------+----------+--------------+   +---------+---------------+---------+-----------+----------+--------------+ LEFT     CompressibilityPhasicitySpontaneityPropertiesThrombus Aging +---------+---------------+---------+-----------+----------+--------------+ CFV      Full           Yes      Yes                                 +---------+---------------+---------+-----------+----------+--------------+ SFJ      Full                                                        +---------+---------------+---------+-----------+----------+--------------+ FV Prox  Full                                                        +---------+---------------+---------+-----------+----------+--------------+ FV Mid   Full                                                         +---------+---------------+---------+-----------+----------+--------------+ FV DistalFull                                                        +---------+---------------+---------+-----------+----------+--------------+ PFV      Full                                                        +---------+---------------+---------+-----------+----------+--------------+ POP      Full           Yes      Yes                                 +---------+---------------+---------+-----------+----------+--------------+ PTV      Full                                                        +---------+---------------+---------+-----------+----------+--------------+ PERO     Full                                                        +---------+---------------+---------+-----------+----------+--------------+  Summary: BILATERAL: - No evidence of deep vein thrombosis seen in the lower extremities, bilaterally. -No evidence of popliteal cyst, bilaterally.   *See table(s) above for measurements and observations. Electronically signed by Coral Else MD on 03/04/2021 at 1:21:10 PM.    Final        The results of significant diagnostics from this hospitalization (including imaging, microbiology, ancillary and laboratory) are listed below for reference.     Microbiology: Recent Results (from the past 240 hour(s))  Resp Panel by RT-PCR (Flu A&B, Covid) Nasopharyngeal Swab     Status: Abnormal   Collection Time: 03/02/21  7:54 AM   Specimen: Nasopharyngeal Swab; Nasopharyngeal(NP) swabs in vial transport medium  Result Value Ref Range Status   SARS Coronavirus 2 by RT PCR POSITIVE (A) NEGATIVE Final    Comment: RESULT CALLED TO, READ BACK BY AND VERIFIED WITH: JACKOB D. ON 03/02/2021 @ 0943 BY MECIAL J. (NOTE) SARS-CoV-2 target nucleic acids are DETECTED.  The SARS-CoV-2 RNA is generally detectable in upper respiratory specimens during the acute phase of infection. Positive results  are indicative of the presence of the identified virus, but do not rule out bacterial infection or co-infection with other pathogens not detected by the test. Clinical correlation with patient history and other diagnostic information is necessary to determine patient infection status. The expected result is Negative.  Fact Sheet for Patients: BloggerCourse.com  Fact Sheet for Healthcare Providers: SeriousBroker.it  This test is not yet approved or cleared by the [place] FDA and  has been authorized for detection and/or diagnosis of SARS-CoV-2 by FDA under an Emergency Use Authorization (EUA).  This EUA will remain in effect (meaning this t est can be used) for the duration of  the COVID-19 declaration under Section 564(b)(1) of the Act, 21 U.S.C. section 360bbb-3(b)(1), unless the authorization is terminated or revoked sooner.     Influenza A by PCR NEGATIVE NEGATIVE Final   Influenza B by PCR NEGATIVE NEGATIVE Final    Comment: (NOTE) The Xpert Xpress SARS-CoV-2/FLU/RSV plus assay is intended as an aid in the diagnosis of influenza from Nasopharyngeal swab specimens and should not be used as a sole basis for treatment. Nasal washings and aspirates are unacceptable for Xpert Xpress SARS-CoV-2/FLU/RSV testing.  Fact Sheet for Patients: BloggerCourse.com  Fact Sheet for Healthcare Providers: SeriousBroker.it  This test is not yet approved or cleared by the [place] FDA and has been authorized for detection and/or diagnosis of SARS-CoV-2 by FDA under an Emergency Use Authorization (EUA). This EUA will remain in effect (meaning this test can be used) for the duration of the COVID-19 declaration under Section 564(b)(1) of the Act, 21 U.S.C. section 360bbb-3(b)(1), unless the authorization is terminated or revoked.  Performed at [facility], [address] [place], [place] [address]      Labs:  CBC: Recent Labs  Lab 03/01/21 2347 03/02/21 0741 03/03/21 0422 03/04/21 0533 03/05/21 0426 03/06/21 0350  WBC 3.3* 3.2* 3.1* 4.3 4.9 6.7  NEUTROABS 2.1  --  1.5* 2.6 3.5 4.7  HGB 14.9 14.1 14.8 14.6 14.9 14.8  HCT 47.1* 43.9 48.1* 46.8* 47.8* 47.4*  MCV 89.0 89.6 91.6 91.6 91.2 91.2  PLT 164 139* 173 213 225 222   BMP &GFR Recent Labs  Lab 03/01/21 2347 03/02/21 0741 03/03/21 0422 03/04/21 0533 03/05/21 0426 03/06/21 0350  NA 137  --  142 141 140 141  K 3.3*  --  4.7 4.7 4.4 3.8  CL 97*  --  106 104 104 106  CO2 28  --  27 28 29 28   GLUCOSE 111*  --  156* 152* 175* 138*  BUN 16  --  19 29* 27* 29*  CREATININE 1.02* 0.93 0.87 0.79 0.75 0.82  CALCIUM 8.8*  --  9.2 9.1 8.7* 8.7*  MG  --  1.5* 2.1 2.0 2.0 1.9  PHOS  --   --  3.3  --   --   --    Estimated Creatinine Clearance: 82.5 mL/min (by C-G formula based on SCr of 0.82 mg/dL). Liver & Pancreas: Recent Labs  Lab 03/03/21 0422 03/04/21 0533 03/05/21 0426 03/06/21 0350  AST 55* 39 54* 90*  ALT 53* 47* 61* 109*  ALKPHOS 60 56 56 58  BILITOT 0.7 0.6 0.5 0.6  PROT 7.2 6.8 6.5 6.4*  ALBUMIN 3.5 3.4* 3.2* 3.2*   No results for input(s): LIPASE, AMYLASE in the last 168 hours. No results for input(s): AMMONIA in the last 168 hours. Diabetic: Recent Labs    03/05/21 0426  HGBA1C 5.8*   Recent Labs  Lab 03/02/21 1632  GLUCAP 145*   Cardiac Enzymes: No results for input(s): CKTOTAL, CKMB, CKMBINDEX, TROPONINI in the last 168 hours. No results for input(s): PROBNP in the last 8760 hours. Coagulation Profile: No results for input(s): INR, PROTIME in the last 168 hours. Thyroid Function Tests: No results for input(s): TSH, T4TOTAL, FREET4, T3FREE, THYROIDAB in the last 72 hours. Lipid Profile: No results for input(s): CHOL, HDL, LDLCALC, TRIG, CHOLHDL, LDLDIRECT in the last 72 hours. Anemia Panel: Recent Labs    03/05/21 0426 03/06/21 0350   FERRITIN 669* 634*   Urine analysis: No results found for: COLORURINE, APPEARANCEUR, LABSPEC, PHURINE, GLUCOSEU, HGBUR, BILIRUBINUR, KETONESUR, PROTEINUR, UROBILINOGEN, NITRITE, LEUKOCYTESUR Sepsis Labs: Invalid input(s): PROCALCITONIN, LACTICIDVEN   Time coordinating discharge: 40 minutes  SIGNED:  03/08/21, MD  [facility] 03/06/2021, 3:02 PM  If 7PM-7AM, please contact night-coverage [email or web address]

## 2021-03-06 NOTE — Progress Notes (Signed)
Mobility Specialist - Progress Note     03/06/21 1551  Mobility  Activity Ambulated in room  Level of Assistance Independent  Assistive Device None  Distance Ambulated (ft) 5 ft  Mobility Ambulated independently in room  Mobility Response Tolerated well  Mobility performed by Mobility specialist  $Mobility charge 1 Mobility    Upon entry pt was ambulating from bathroom to bed. Mobility specialist assisted in helping pt sit and get back to bed. Pt was left sitting EOB, call bell at side, and awaiting d/c.   Arliss Journey Mobility Specialist Acute Rehabilitation Services Phone: [phone] 03/06/21, 3:53 PM

## 2021-03-06 NOTE — Progress Notes (Signed)
Pt O2 requirement evaluation on at rest and with exertion:    Pt O2 Sat on RA at rest 91%  Pt O2 Sat on RA with exertion 87%  Pt O2 sat on 2L [place] at rest 94%  Pt O2 sat on 2L [place] with exertion 91%

## 2021-03-06 NOTE — TOC Transition Note (Signed)
Transition of Care [facility]) - CM/SW Discharge Note   Patient Details  Name: Aslan Montagna MRN: [id number] Date of Birth: 07/23/1951  [facility] CM/SW Contact:  Darleene Cleaver, LCSW Phone Number: 03/06/2021, 5:12 PM   Clinical Narrative:     CSW was informed that patient will need oxygen before she leaves.  Patient will be provided oxygen by [employer].  CSW spoke to [place] at [place] and he is agreeable to getting patient her oxygen before she leaves.  CSW signing off.   Final next level of care: Home/Self Care Barriers to Discharge: Barriers Resolved   Patient Goals and CMS Choice Patient states their goals for this hospitalization and ongoing recovery are:: To return back home. CMS Medicare.gov Compare Post Acute Care list provided to:: Patient Choice offered to / list presented to : Patient  Discharge Placement                       Discharge Plan and Services                DME Arranged: Oxygen DME Agency: [employer] Date DME Agency Contacted: 03/06/21 Time DME Agency Contacted: [phone] Representative spoke with at DME Agency: Vaughan Basta            Social Determinants of Health (SDOH) Interventions     Readmission Risk Interventions No flowsheet data found.

## 2023-05-04 IMAGING — DX DG CHEST 1V PORT
1 series · 1 of 1 positions shown · non-contrast
Comparison: None.

CLINICAL DATA: Cough and body ache COVID

EXAM:
PORTABLE CHEST 1 VIEW

[chest ap]
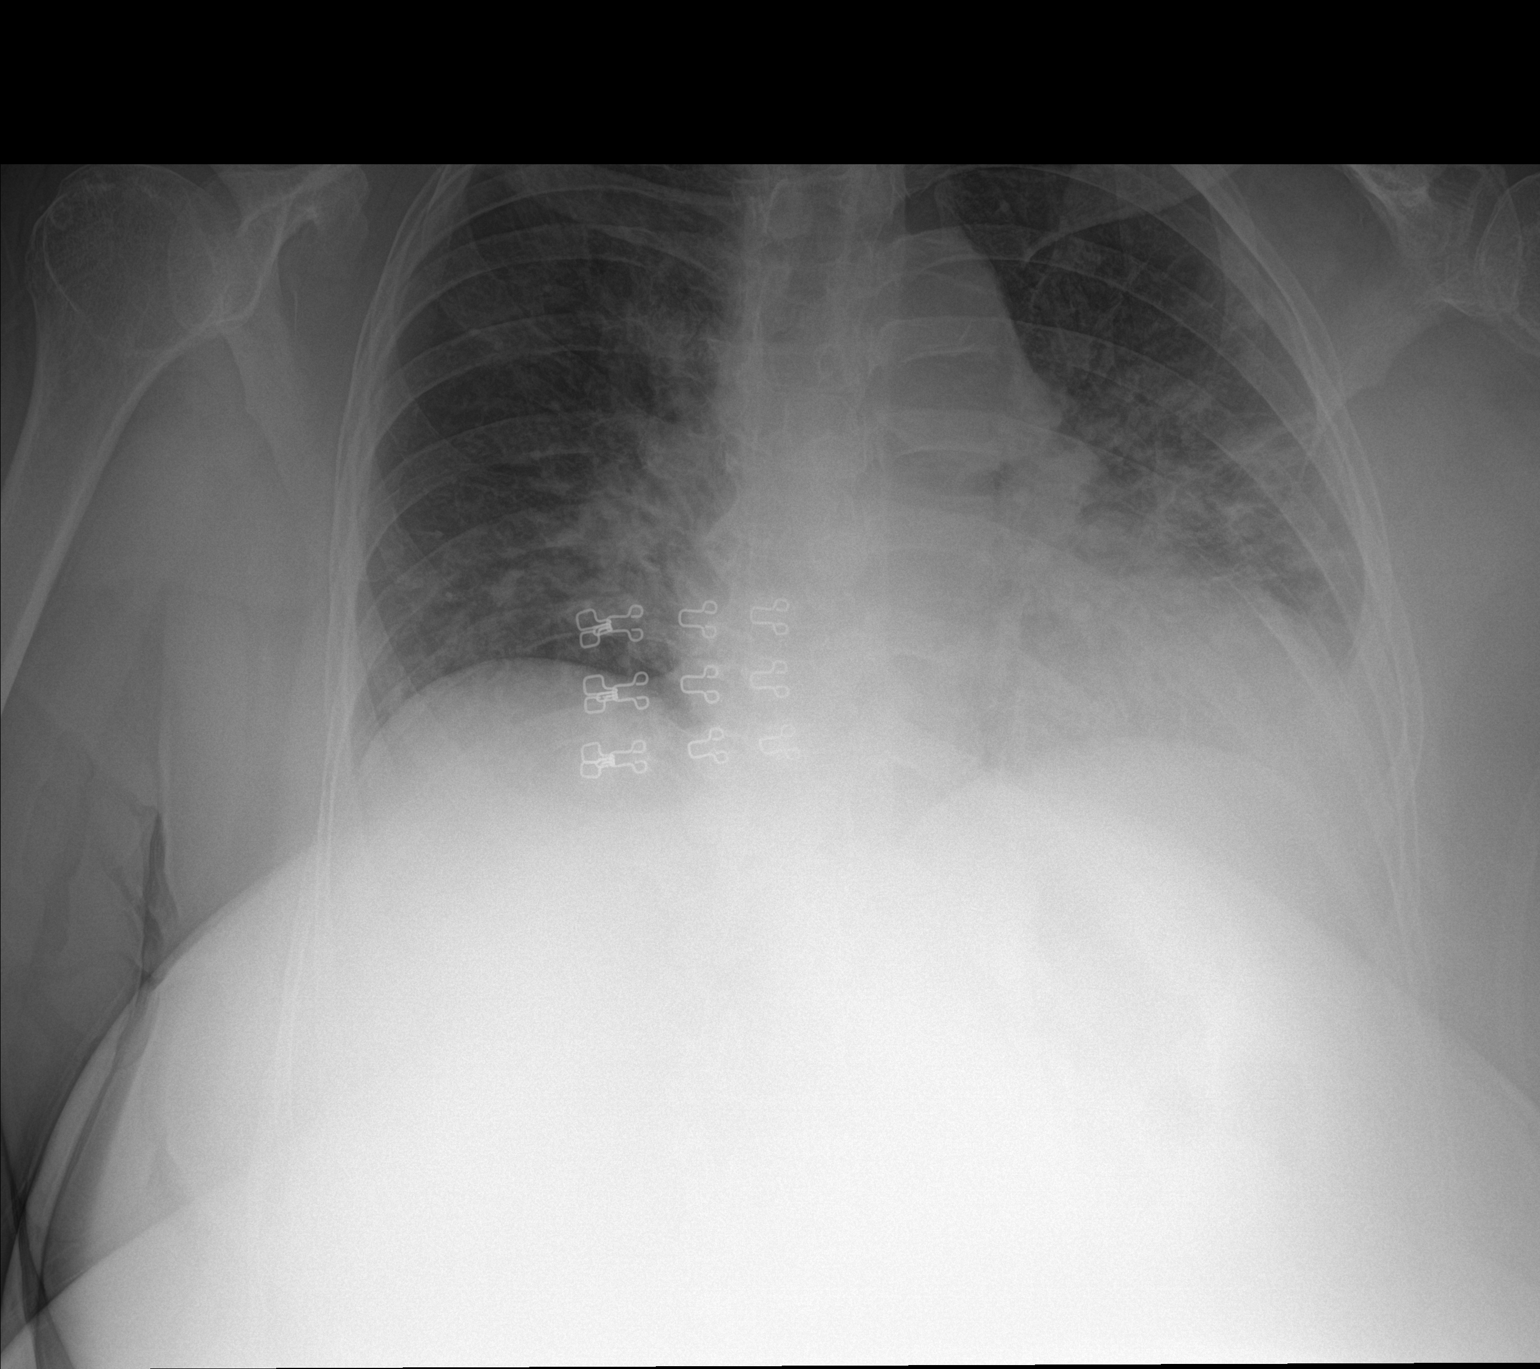

[1 of 1 positions shown; findings below may reference images not displayed]

FINDINGS: Mild cardiomegaly. Streaky left greater than right perihilar
opacity. No pleural effusion or pneumothorax
IMPRESSION: 1. Streaky left greater than right perihilar opacity suspicious for
pneumonia
2. Mild cardiomegaly

## 2024-06-15 ENCOUNTER — Ambulatory Visit: Payer: Self-pay

## 2024-06-15 NOTE — Telephone Encounter (Signed)
 FYI Only or Action Required?: FYI only for provider: appointment scheduled on 12/8 new pt appt.  Called Nurse Triage reporting Ankle Problem.  Symptoms began several months ago. Brother/caller states that he believes she has been having the swelling in the BLE and exertional SOB for months, possibly years.   Interventions attempted: Nothing.  Symptoms are: unchanged.  Triage Disposition: See PCP Within 2 Weeks  Patient/caregiver understands and will follow disposition?: Yes, will follow disposition  Copied from CRM [id number]. Topic: Clinical - Red Word Triage >> Jun 15, 2024  3:27 PM Rosaria BRAVO wrote: Red Word that prompted transfer to Nurse Triage: Pt's brother elsie (currently with patient) reports swelling in ankles and discoloration. Reason for Disposition  [1] MILD swelling of both ankles (i.e., pedal edema) AND [2] is a chronic symptom (recurrent or ongoing AND present > 4 weeks)  Answer Assessment - Initial Assessment Questions 1. ONSET: When did the swelling start? (e.g., minutes, hours, days)     Unsure, family is calling states that he hasn't seen pt for about 6 months, states that have changed and gotten worse, brother states that the discoloration has deepened -states they are reddish brown 2. LOCATION: What part of the leg is swollen?  Are both legs swollen or just one leg?     BLE, mainly ankles 3. SEVERITY: How bad is the swelling? (e.g., localized; mild, moderate, severe)     moderate 4. REDNESS: Is there redness or signs of infection?     Denies redness, denies open areas,  5. PAIN: Is the swelling painful to touch? If Yes, ask: How painful is it?   (Scale 1-10; mild, moderate or severe)     denies 6. FEVER: Do you have a fever? If Yes, ask: What is it, how was it measured, and when did it start?      denies 7. CAUSE: What do you think is causing the leg swelling?     Unsure, pt has not been following with PCP, new pt appt scheduled 8.  MEDICAL HISTORY: Do you have a history of blood clots (e.g., DVT), cancer, heart failure, kidney disease, or liver failure?     denies 9. RECURRENT SYMPTOM: Have you had leg swelling before? If Yes, ask: When was the last time? What happened that time?     Has had swelling previously but unsure if the pt has ever been worked up for this  10. OTHER SYMPTOMS: Do you have any other symptoms? (e.g., chest pain, difficulty breathing)       Caller does not believe that pt has SOB at rest-states she has SOB with exertion-like walking across the room, denies CP, states low back pain  Protocols used: Leg Swelling and Edema-A-AH

## 2024-06-16 ENCOUNTER — Telehealth (HOSPITAL_BASED_OUTPATIENT_CLINIC_OR_DEPARTMENT_OTHER): Payer: Self-pay | Admitting: *Deleted

## 2024-06-16 NOTE — Telephone Encounter (Signed)
 Pt will be a new patient establishing with the practice. Since pt will be a new patient, provider will decide if she will be able to do a full physical at same appt as pt establishing with the practice or if this physical will need to be done on a different day.

## 2024-06-16 NOTE — Telephone Encounter (Signed)
 Copied from CRM #[id number]. Topic: Appointments - Scheduling Inquiry for Clinic >> Jun 15, 2024  3:25 PM Rosaria BRAVO wrote: Reason for CRM: Pt's brother wants the patient to have a full physical during her upcoming new patient appt.

## 2024-06-29 ENCOUNTER — Encounter (HOSPITAL_BASED_OUTPATIENT_CLINIC_OR_DEPARTMENT_OTHER): Payer: Self-pay

## 2024-06-29 ENCOUNTER — Ambulatory Visit (HOSPITAL_BASED_OUTPATIENT_CLINIC_OR_DEPARTMENT_OTHER)

## 2024-06-29 ENCOUNTER — Ambulatory Visit (INDEPENDENT_AMBULATORY_CARE_PROVIDER_SITE_OTHER)

## 2024-06-29 VITALS — BP 187/98 | HR 65 | Ht 65.0 in | Wt 284.4 lb

## 2024-06-29 DIAGNOSIS — Z7689 Persons encountering health services in other specified circumstances: Secondary | ICD-10-CM

## 2024-06-29 DIAGNOSIS — I872 Venous insufficiency (chronic) (peripheral): Secondary | ICD-10-CM

## 2024-06-29 DIAGNOSIS — M5431 Sciatica, right side: Secondary | ICD-10-CM

## 2024-06-29 DIAGNOSIS — I10 Essential (primary) hypertension: Secondary | ICD-10-CM

## 2024-06-29 LAB — COMPREHENSIVE METABOLIC PANEL WITH GFR
ALT: 23 IU/L (ref 0–32)
AST: 24 IU/L (ref 0–40)
Albumin: 4.3 g/dL (ref 3.8–4.8)
Alkaline Phosphatase: 95 IU/L (ref 49–135)
BUN/Creatinine Ratio: 26 (ref 12–28)
BUN: 20 mg/dL (ref 8–27)
Bilirubin Total: 0.5 mg/dL (ref 0.0–1.2)
CO2: 25 mmol/L (ref 20–29)
Calcium: 9.6 mg/dL (ref 8.7–10.3)
Chloride: 103 mmol/L (ref 96–106)
Creatinine, Ser: 0.78 mg/dL (ref 0.57–1.00)
Globulin, Total: 2.4 g/dL (ref 1.5–4.5)
Glucose: 95 mg/dL (ref 70–99)
Potassium: 4.5 mmol/L (ref 3.5–5.2)
Sodium: 141 mmol/L (ref 134–144)
Total Protein: 6.7 g/dL (ref 6.0–8.5)
eGFR: 81 mL/min/1.73 (ref >59)

## 2024-06-29 NOTE — Progress Notes (Unsigned)
 New Patient Office Visit  Subjective:   Stacie Logan 1951/06/17 06/29/2024  Chief Complaint  Patient presents with   New Patient (Initial Visit)    Patient is here to get established with the practice. Patient has been having issues walking. Patient has groin pain near her right leg. Patient also has leg and feet discoloration and swelling. Has had these issues for about 5 years or less. Patient wants physical.    History of Present Illness         HPI: Stacie Logan presents today to establish care at [facility] at [facility]. Introduced to publishing rights manager role and practice setting with verbalized understanding by patient.  All questions answered.   Last PCP: unknown Last annual physical: unknown Concerns: See below   Pt is here, accompanied by her brother, to establish practice here at the clinic. Pt is not currently on any medications other than vitamins. Pt has a few different concerns that she feels stems from her slow metabolism & her lymphatic system being slower than normal. Pt states at some point she remembers being told she has thyroid problems but has never continuously taken any medicaiton. When patient was in the hospital, she states she was given medication for her blood pressure but did not want to continue the medication after discharge. Pt extremely hesitant to begin any current medication. The main concerns today are regarding her bilateral ankle discoloration and inability to walk.   Leg pain: Pt states the pain began around 1971 and has progressively gotten worse. States the pain is in her right side mainly but extends posteriorly to her knee and intermittently wraps around to her groin. Pt states she remembers trying to get into bed one night and feeling something off causing her to have pain since. Pt has never been seen for this concern and only takes 200mg  ibuprofen every Thursday prior to bingo night to help with  the pain. Pt endorses needing crutches to walk due to her inability to bear weight on the right leg for extended periods due to the pain. States pain is 9/10 and is sharp/stabbing in nature.  Bilateral leg swelling: Pt states she is unsure when the discoloration began, but feels it has worsened more recently. Denies any numbness or tingling in her feet. Denies any recent trauma or injury. Pt feels her swelling has gotten worse in her lower extremities and is extending up into her thighs.    The following portions of the patient's history were reviewed and updated as appropriate: past medical history, past surgical history, family history, social history, allergies, medications, and problem list.   Patient Active Problem List   Diagnosis Date Noted   Acute hypoxemic respiratory failure due to COVID-19 (HCC) 03/02/2021   Pneumonia due to COVID-19 virus 03/02/2021   Hypokalemia 03/02/2021   AKI (acute kidney injury) 03/02/2021   Leukopenia 03/02/2021   Past Medical History:  Diagnosis Date   Obesity    Sciatica    History reviewed. No pertinent surgical history. Family History  Problem Relation Age of Onset   Hypertension Father    Social History   Socioeconomic History   Marital status: Single    Spouse name: Not on file   Number of children: Not on file   Years of education: Not on file   Highest education level: Not on file  Occupational History   Not on file  Tobacco Use   Smoking status: Never   Smokeless tobacco:  Never  Vaping Use   Vaping status: Never Used  Substance and Sexual Activity   Alcohol use: Never    Alcohol/week: 1.0 standard drink of alcohol    Types: 1 Glasses of wine per week    Comment: socially   Drug use: Never   Sexual activity: Never  Other Topics Concern   Not on file  Social History Narrative   Not on file   Social Drivers of Health   Financial Resource Strain: Low Risk  (06/29/2024)   Overall Financial Resource Strain (CARDIA)     Difficulty of Paying Living Expenses: Not very hard  Food Insecurity: No Food Insecurity (06/29/2024)   Hunger Vital Sign    Worried About Running Out of Food in the Last Year: Never true    Ran Out of Food in the Last Year: Never true  Transportation Needs: No Transportation Needs (06/29/2024)   PRAPARE - Administrator, Civil Service (Medical): No    Lack of Transportation (Non-Medical): No  Physical Activity: Inactive (06/29/2024)   Exercise Vital Sign    Days of Exercise per Week: 0 days    Minutes of Exercise per Session: 0 min  Stress: No Stress Concern Present (06/29/2024)   [employer] of Occupational Health - Occupational Stress Questionnaire    Feeling of Stress: Not at all  Social Connections: Moderately Isolated (06/29/2024)   Social Connection and Isolation Panel    Frequency of Communication with Friends and Family: More than three times a week    Frequency of Social Gatherings with Friends and Family: Twice a week    Attends Religious Services: Never    Database Administrator or Organizations: Yes    Attends Engineer, Structural: More than 4 times per year    Marital Status: Never married  Intimate Partner Violence: Not At Risk (06/29/2024)   Humiliation, Afraid, Rape, and Kick questionnaire    Fear of Current or Ex-Partner: No    Emotionally Abused: No    Physically Abused: No    Sexually Abused: No   Outpatient Medications Prior to Visit  Medication Sig Dispense Refill   co-enzyme Q-10 30 MG capsule Take 30 mg by mouth 3 (three) times daily.     Multiple Vitamins-Minerals (MULTIVITAMIN WITH MINERALS) tablet Take 1 tablet by mouth daily.     zinc  gluconate 50 MG tablet Take 50 mg by mouth daily.     guaiFENesin  (MUCINEX ) 600 MG 12 hr tablet Take 1 tablet (600 mg total) by mouth 2 (two) times daily as needed for cough.     losartan  (COZAAR ) 50 MG tablet Take 1 tablet (50 mg total) by mouth daily. 90 tablet 1   predniSONE  (DELTASONE ) 50 MG  tablet Take 1 tablet (50 mg total) by mouth daily with breakfast. 4 tablet 0   No facility-administered medications prior to visit.   No Known Allergies  ROS: A complete ROS was performed with pertinent positives/negatives noted in the HPI. The remainder of the ROS are negative.   Objective:   Today's Vitals   06/29/24 1301 06/29/24 1345  BP: (!) 167/89 (!) 187/98  Pulse: 65   SpO2: 100%   Weight: 284 lb 6.4 oz (129 kg)   Height: 5' 5 (1.651 m)     GENERAL: Well-appearing, in NAD. Well nourished. SKIN: Pink, warm and dry. No rash, lesion, ulceration, or ecchymoses. Discoloration noted to bilateral lower extremities  RESPIRATORY: Chest wall symmetrical. Respirations even and non-labored. Breath sounds clear to  auscultation bilaterally.  CARDIAC: S1, S2 present, regular rate and rhythm without murmur or gallops. Peripheral pulses 2+ bilaterally.  MSK: Muscle tone and strength appropriate for age. Joints w/o tenderness, redness, or swelling. Pain upon abduction of R hip EXTREMITIES: Without clubbing, cyanosis, or edema. 2+ pitting edema noted to bilateral lower extremities.  PSYCH/MENTAL STATUS: Alert, oriented x 3. Cooperative, appropriate mood and affect.    Health Maintenance Due  Topic Date Due   Medicare Annual Wellness (AWV)  Never done   Hepatitis C Screening  Never done   DTaP/Tdap/Td (1 - Tdap) Never done   Mammogram  Never done   Colonoscopy  Never done   Pneumococcal Vaccine: 50+ Years (1 of 1 - PCV) Never done   Zoster Vaccines- Shingrix (1 of 2) Never done   Bone Density Scan  Never done   Influenza Vaccine  Never done   COVID-19 Vaccine (1 - 2025-26 season) Never done    No results found for any visits on 06/29/24.     Assessment & Plan:  1. Encounter to establish care with new doctor (Primary) Discussed patient's medical, familial, and surgical history.   2. Sciatica of right side Discussed potential of pain coming from either the patient's hip or lumbar  spine and the need for appropriate imaging. Also explained the importance of establishing with physical therapy for further evaluation.  - DG Lumbar Spine Complete; Future - Ambulatory referral to Physical Therapy - DG Hip Unilat W OR W/O Pelvis 2-3 Views Left; Future  3. Hypertension, unspecified type Patient has never been formally diagnosed with HTN that she remembers other than her hospital admission years ago. I discussed with the patient the need to begin hypertension medication if hypertension does not resolve within the next week. I discussed with the patient the increased risk for heart attack and stroke. Patient was very apprehensive to beginning HTN medication and wanted to give herself a few days to log home blood pressures in hopes she will not need medication. I discussed the benefits of dietary and lifestyle changes, but I also discussed the reality of her blood pressure being in a concerning range. Pt verbalized understanding. Pt not currently experiencing any red flag symptoms but advised if she does to immediately seek emergency treatment. CMP ordered to check pt kidney function today.  - Comprehensive Metabolic Panel (CMET)  4. Venous insufficiency Discussed the need for an ultrasound of bilateral lower extremities to monitor blood flow as I am concerned she has inappropriate blood flow to her legs. Because of this, I believe the patient has some extent of venous insufficiency thus ordering the US  today. Patient's legs are equal in temperature and color. Discussed red flag symptoms with concern for blood clots and when to seek emergency care.  - VAS US  LOWER EXTREMITY VENOUS REFLUX; Future   Patient to reach out to office if new, worrisome, or unresolved symptoms arise or if no improvement in patient's condition. Patient verbalized understanding and is agreeable to treatment plan. All questions answered to patient's satisfaction.    Return in 4 days (on 07/03/2024) for nurse visit  - blood pressure check .    Lauraine Almarie Angus DNP, FNP-C

## 2024-06-30 ENCOUNTER — Ambulatory Visit (HOSPITAL_BASED_OUTPATIENT_CLINIC_OR_DEPARTMENT_OTHER): Payer: Self-pay

## 2024-06-30 NOTE — Progress Notes (Signed)
 Stacie Logan, Your CMP is normal which is great! Please continue to plan to come in on Friday for a repeat blood pressure check!  Thanks!

## 2024-07-03 ENCOUNTER — Ambulatory Visit (INDEPENDENT_AMBULATORY_CARE_PROVIDER_SITE_OTHER)

## 2024-07-03 ENCOUNTER — Encounter (HOSPITAL_BASED_OUTPATIENT_CLINIC_OR_DEPARTMENT_OTHER): Payer: Self-pay

## 2024-07-03 DIAGNOSIS — I10 Essential (primary) hypertension: Secondary | ICD-10-CM

## 2024-07-03 NOTE — Progress Notes (Signed)
 Pt denies CP, SOB, dizziness, or heart palpitations. taking meds as directed without problems. Denies med side effects. 5 min spent with pt.

## 2024-07-03 NOTE — Progress Notes (Signed)
 Please see mychart message.

## 2024-07-03 NOTE — Progress Notes (Signed)
 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 Your hip xray is also showing loss of join space in your right hip which I believe to be the cause of your increased pain upon walking/standing for long periods. I would like you to continue to follow up with physical therapy. I have also placed a referral for orthopedics where they are able to further manage both your back and hip pain. Please reach out with any concerns. Thanks!  Lauraine Norris

## 2024-07-06 ENCOUNTER — Encounter (HOSPITAL_BASED_OUTPATIENT_CLINIC_OR_DEPARTMENT_OTHER): Payer: Self-pay

## 2024-07-08 ENCOUNTER — Encounter (HOSPITAL_BASED_OUTPATIENT_CLINIC_OR_DEPARTMENT_OTHER): Payer: Self-pay | Admitting: *Deleted

## 2024-07-17 ENCOUNTER — Encounter (HOSPITAL_COMMUNITY): Payer: Self-pay

## 2024-07-27 ENCOUNTER — Telehealth (HOSPITAL_BASED_OUTPATIENT_CLINIC_OR_DEPARTMENT_OTHER): Payer: Self-pay

## 2024-07-27 NOTE — Telephone Encounter (Signed)
 Copied from CRM #[id number]. Topic: Medicare AWV >> Jul 27, 2024  2:08 PM Nathanel DEL wrote: Called LVM 07/27/2024 to sched AWVI. Please schedule AWVI in office.   Nathanel Paschal; Care Guide Ambulatory Clinical Support [facility] Direct Dial: [phone]

## 2024-08-20 ENCOUNTER — Encounter (HOSPITAL_COMMUNITY): Payer: Self-pay
